# Patient Record
Sex: Female | Born: 1995
Health system: Southern US, Community
[De-identification: ages and names within clinical notes are randomized; demographics above are authoritative.]

## PROBLEM LIST (undated history)

## (undated) DIAGNOSIS — M419 Scoliosis, unspecified: Secondary | ICD-10-CM

## (undated) HISTORY — PX: TONSILLECTOMY: SUR1361

## (undated) HISTORY — PX: TYMPANOSTOMY TUBE PLACEMENT: SHX32

---
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
## 2013-03-06 ENCOUNTER — Encounter: Payer: Self-pay | Admitting: Nurse Practitioner

## 2013-03-12 ENCOUNTER — Telehealth: Payer: Self-pay | Admitting: Nurse Practitioner

## 2013-03-12 NOTE — Telephone Encounter (Signed)
Appt given for 10-31 per grandmothers request. Offered an earlier appt but patient away at college and can only be seen then

## 2013-03-20 ENCOUNTER — Ambulatory Visit: Payer: Managed Care, Other (non HMO) | Admitting: Family Medicine

## 2013-03-20 ENCOUNTER — Encounter: Payer: Self-pay | Admitting: Nurse Practitioner

## 2013-07-29 ENCOUNTER — Encounter: Payer: Self-pay | Admitting: Nurse Practitioner

## 2013-07-29 ENCOUNTER — Ambulatory Visit (INDEPENDENT_AMBULATORY_CARE_PROVIDER_SITE_OTHER): Payer: Managed Care, Other (non HMO) | Admitting: Nurse Practitioner

## 2013-07-29 VITALS — BP 156/81 | HR 93 | Temp 98.6°F | Ht 64.0 in | Wt 242.4 lb

## 2013-07-29 DIAGNOSIS — K2970 Gastritis, unspecified, without bleeding: Secondary | ICD-10-CM

## 2013-07-29 DIAGNOSIS — K2990 Gastroduodenitis, unspecified, without bleeding: Secondary | ICD-10-CM

## 2013-07-29 MED ORDER — PROMETHAZINE HCL 12.5 MG PO TABS: 12.5000 mg | ORAL_TABLET | Freq: Three times a day (TID) | ORAL | Status: DC | PRN

## 2013-07-29 NOTE — Progress Notes (Signed)
   Subjective:    Patient ID: Stacey Gonzalez, female    DOB: 07/06/1995, 18 y.o.   MRN: [id number]  HPI Patient presents today complaining of diarrhea. Began 3 days ago and have gotten worse. Associated symptoms include nausea, decreased appetite, and patient felt fever.  Has been around classmate who had stomach virus. No medication taken.    Review of Systems  Constitutional: Positive for appetite change and fatigue. Negative for fever and chills.  Respiratory: Negative for shortness of breath.   Cardiovascular: Negative for chest pain.  Gastrointestinal: Positive for nausea and diarrhea. Negative for vomiting.  All other systems reviewed and are negative.       Objective:   Physical Exam  Constitutional: She is oriented to person, place, and time.  Cardiovascular: Normal rate, regular rhythm and normal heart sounds.   Pulmonary/Chest: Effort normal and breath sounds normal.  Abdominal: Soft. Bowel sounds are normal. She exhibits no distension and no mass. There is no tenderness. There is no rebound and no guarding.  Neurological: She is alert and oriented to person, place, and time.  Skin: Skin is warm and dry.  Psychiatric: She has a normal mood and affect. Her behavior is normal. Judgment and thought content normal.   BP 156/81  Pulse 93  Temp(Src) 98.6 F (37 C) (Oral)  Ht 5\' 4"  (1.626 m)  Wt 242 lb 6.4 oz (109.952 kg)  BMI 41.59 kg/m2         Assessment & Plan:   1. Viral gastritis    Meds ordered this encounter  Medications  . promethazine (PHENERGAN) 12.5 MG tablet    Sig: Take 1 tablet (12.5 mg total) by mouth every 8 (eight) hours as needed for nausea or vomiting.    Dispense:  10 tablet    Refill:  0    Order Specific Question:  Supervising Provider    Answer:  Deborra MedinaMOORE, DONALD W [[id number]]   Force fluids Imodium AD OTC Rest Clear liquid diet until able to tolerate solid foods  Mary-Margaret Daphine DeutscherMartin, FNP

## 2013-07-29 NOTE — Patient Instructions (Signed)

## 2013-12-03 ENCOUNTER — Encounter: Payer: Self-pay | Admitting: Nurse Practitioner

## 2013-12-03 ENCOUNTER — Ambulatory Visit (INDEPENDENT_AMBULATORY_CARE_PROVIDER_SITE_OTHER): Payer: Managed Care, Other (non HMO) | Admitting: Nurse Practitioner

## 2013-12-03 VITALS — BP 122/82 | HR 87 | Temp 98.7°F | Ht 64.0 in | Wt 240.4 lb

## 2013-12-03 DIAGNOSIS — N926 Irregular menstruation, unspecified: Secondary | ICD-10-CM

## 2013-12-03 DIAGNOSIS — B009 Herpesviral infection, unspecified: Secondary | ICD-10-CM

## 2013-12-03 MED ORDER — VALACYCLOVIR HCL 1 G PO TABS: 1000.0000 mg | ORAL_TABLET | Freq: Three times a day (TID) | ORAL | Status: DC

## 2013-12-03 NOTE — Patient Instructions (Signed)
Menstruation °Menstruation is the monthly passing of blood, tissue, fluid and mucus, also know as a period. Your body is shedding the lining of the uterus. The flow, or amount of blood, usually lasts from 3-7 days each month. Hormones control the menstrual cycle. Hormones are a chemical substance produced by endocrine glands in the body to regulate different bodily functions. °The first menstrual period may start any time between age 20 years to 16 years. However, it usually starts around age 12 years. Some girls have regular monthly menstrual cycles right from the beginning. However, it is not unusual to have only a couple of drops of blood or spotting when you first start menstruating. It is also not unusual to have two periods a month or miss a month or two when first starting your periods. °SYMPTOMS  °· Mild to moderate abdominal cramps. °· Aching or pain in the lower back area. °Symptoms may occur 5-10 days before your menstrual period starts. These symptoms are referred to as premenstrual syndrome (PMS). These symptoms can include: °· Headache. °· Breast tenderness and swelling. °· Bloating. °· Tiredness (fatigue). °· Mood changes. °· Craving for certain foods. °These are normal signs and symptoms and can vary in severity. To help relieve these problems, ask your caregiver if you can take over-the-counter medications for pain or discomfort. If the symptoms are not controllable, see your caregiver for help.  °HORMONES INVOLVED IN MENSTRUATION °Menstruation comes about because of hormones produced by the pituitary gland in the brain and the ovaries that affect the uterine lining. °First, the pituitary gland in the brain produces the hormone follicle stimulating hormone (FSH). FSH stimulates the ovaries to produce estrogen, which thickens the uterine lining and begins to develop an egg in the ovary. About 14 days later, the pituitary gland produces another hormone called luteinizing hormone (LH). LH causes the egg  to come out of a sac in the ovary (ovulation). The empty sac on the ovary called the corpus luteum is stimulated by another hormone from the pituitary gland called luteotropin. The corpus luteum begins to produce the estrogen and progesterone hormone. The progesterone hormone prepares the lining of the uterus to have the fertilized egg (egg combined with sperm) attach to the lining of the uterus and begin to develop into a fetus. If the egg is not fertilized, the corpus luteum stops producing estrogen and progesterone, it disappears, the lining of the uterus sloughs off and a menstrual period begins. Then the menstrual cycle starts all over again and will continue monthly unless pregnancy occurs or menopause begins. °The secretion of hormones is complex. Various parts of the body become involved in many chemical activities. Female sex hormones have other functions in a woman's body as well. Estrogen increases a woman's sex drive (libido). It naturally helps body get rid of fluids (diuretic). It also aids in the process of building new bone. Therefore, maintaining hormonal health is essential to all levels of a woman's well being. These hormones are usually present in normal amounts and cause you to menstruate. It is the relationship between the (small) levels of the hormones that is critical. When the balance is upset, menstrual irregularities can occur. °HOW DOES THE MENSTRUAL CYCLE HAPPEN? °· Menstrual cycles vary in length from 21-35 days with an average of 29 days. The cycle begins on the first day of bleeding. At this time, the pituitary gland in the brain releases FSH that travels through the bloodstream to the ovaries. The FSH stimulates the follicles in the   ovaries. This prepares the body for ovulation that occurs around the 14th day of the cycle. The ovaries produce estrogen, and this makes sure conditions are right in the uterus for implantation of the fertilized egg. °· When the levels of estrogen reach a  high enough level, it signals the gland in the brain (pituitary gland) to release a surge of LH. This causes the release of the ripest egg from its follicle (ovulation). Usually only one follicle releases one egg, but sometimes more than one follicle releases an egg especially when stimulating the ovaries for in vitro fertilization. The egg can then be collected by either fallopian tube to await fertilization. The burst follicle within the ovary that is left behind is now called the corpus luteum or "yellow body." The corpus luteum continues to give off (secrete) reduced amounts of estrogen. This closes and hardens the cervix. It dries up the mucus to the naturally infertile condition. °· The corpus luteum also begins to give off greater amounts of progesterone. This causes the lining of the uterus (endometrium) to thicken even more in preparation for the fertilized egg. The egg is starting to journey down from the fallopian tube to the uterus. It also signals the ovaries to stop releasing eggs. It assists in returning the cervical mucus to its infertile state. °· If the egg implants successfully into the womb lining and pregnancy occurs, progesterone levels will continue to raise. It is often this hormone that gives some pregnant women a feeling of well being, like a "natural high." Progesterone levels drop again after childbirth. °· If fertilization does not occur, the corpus luteum dies, stopping the production of hormones. This sudden drop in progesterone causes the uterine lining to break down, accompanied by blood (menstruation). °· This starts the cycle back at day 1. The whole process starts all over again. Woman go through this cycle every month from puberty to menopause. Women have breaks only for pregnancy and breastfeeding (lactation), unless the woman has health problems that affect the female hormone system or chooses to use oral contraceptives to have unnatural menstrual periods. °HOME CARE  INSTRUCTIONS  °· Keep track of your periods by using a calendar. °· If you use tampons, get the least absorbent to avoid toxic shock syndrome. °· Do not leave tampons in the vagina over night or longer than 6 hours. °· Wear a sanitary pad over night. °· Exercise 3-5 times a week or more. °· Avoid foods and drinks that you know will make your symptoms worse before or during your period. °SEEK MEDICAL CARE IF:  °· You develop a fever with your period. °· Your periods are lasting more than 7 days. °· Your period is so heavy that you have to change pads or tampons every 30 minutes. °· You develop clots with your period and never had clots before. °· You cannot get relief from over-the-counter medication for your symptoms. °· Your period has not started, and it has been longer than 35 days. °Document Released: 04/27/2002 Document Revised: 05/12/2013 Document Reviewed: 12/04/2012 °[employer]® Patient Information ©2015 [employer]. This information is not intended to replace advice given to you by your health care provider. Make sure you discuss any questions you have with your health care provider. ° °

## 2013-12-03 NOTE — Progress Notes (Signed)
   Subjective:    Patient ID: Stacey Gonzalez, female    DOB: 11/08/1995, 18 y.o.   MRN: [id number]  HPI Patient is here today for menstrual irregularities. She is currently taking Junel FE. She reports her last regular cycle was 6 to 7 months ago. She reports spotting once a month  Upper lip macule that she notices on Monday and swelling the following day. Get better as the day progress. She reports the swelling is worse in the morning. She has applied ice,    Review of Systems  All other systems reviewed and are negative.      Objective:   Physical Exam  Constitutional: She is oriented to person, place, and time. She appears well-developed and well-nourished.  Cardiovascular: Normal rate, regular rhythm and normal heart sounds.   Pulmonary/Chest: Effort normal and breath sounds normal.  Neurological: She is alert and oriented to person, place, and time.  Skin: Skin is warm and dry.  Erythematous lesion on eft upper lip with mild edema  Psychiatric: She has a normal mood and affect. Her behavior is normal. Judgment and thought content normal.   BP 122/82  Pulse 87  Temp(Src) 98.7 F (37.1 C) (Oral)  Ht 5\' 4"  (1.626 m)  Wt 240 lb 6.4 oz (109.045 kg)  BMI 41.24 kg/m2        Assessment & Plan:  1. Herpes simplex type 1 infection Avoid tomatoes - valACYclovir (VALTREX) 1000 MG tablet; Take 1 tablet (1,000 mg total) by mouth 3 (three) times daily.  Dispense: 21 tablet; Refill: 0  2. Irregular menses As long as having a period that is all that matters- do not have  To have a long menses to be normal.   Mary-Margaret Daphine DeutscherMartin, FNP

## 2014-08-12 ENCOUNTER — Ambulatory Visit (INDEPENDENT_AMBULATORY_CARE_PROVIDER_SITE_OTHER): Payer: Managed Care, Other (non HMO)

## 2014-08-12 ENCOUNTER — Ambulatory Visit (INDEPENDENT_AMBULATORY_CARE_PROVIDER_SITE_OTHER): Payer: Managed Care, Other (non HMO) | Admitting: Orthopedic Surgery

## 2014-08-12 VITALS — BP 95/56 | Ht 64.0 in | Wt 250.0 lb

## 2014-08-12 DIAGNOSIS — M25511 Pain in right shoulder: Secondary | ICD-10-CM

## 2014-08-12 NOTE — Progress Notes (Signed)
  Stacey Gonzalez is a 19 y.o. female  Shoulder Pain:   Chief Complaint  Patient presents with  . 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 Denies any history of trauma. Symptoms for one month improved with rest and ibuprofen. She is on spring break and notices that she's not carrying the heavy book bag and the symptoms are much improved. She complains of dull burning aching pain constantly over the right shoulder joint.  Her review of systems is normal  She's healthy without medical problems or surgical history she does take medication which is for anemia. She has a allergy to Zithromax and latex. No smoking or drinking family history is negative   No past medical history on file. No past surgical history on file.  Current outpatient prescriptions:  .  JUNEL FE 1.5/30 1.5-30 MG-MCG tablet, TAKE 1 TABLET BY MOUTH EVERY DAY, Disp: 28 tablet, Rfl: 13 Allergies  Allergen Reactions  . Zithromax [Azithromycin] Rash    reports that she has never smoked. She does not have any smokeless tobacco history on file. She reports that she does not drink alcohol or use illicit drugs. Family History  Problem Relation Age of Onset  . Cancer Mother     colorectal    BP 95/56 mmHg  Ht 5\' 4"  (1.626 m)  Wt 250 lb (113.399 kg)  BMI 42.89 kg/m2  LMP 08/11/2014 (Approximate) She is awake alert and oriented 3 mood and affect are normal her appearance is normal except for some mild obesity. Her mood is pleasant her affect is normal her ambulation is unremarkable Right Shoulder: Inspection reveals no abnormalities, atrophy or asymmetry. Palpation reveals tenderness in the anterior shoulder joint and rotator interval no tenderness over AC joint or bicipital groove. ROM is full in all planes. Rotator cuff strength normal throughout. No signs of impingement with negative  Neer and Hawkin's tests, empty can. Speeds and Yergason's tests normal. No labral pathology noted with negative Obrien's, negative clunk and good stability. Normal scapular function observed. No painful arc and no drop arm sign. No apprehension sign.  Cervical spine nontender  Left shoulder normal range of motion stability and strength  Office x-rays were ordered I interpreted those is normal without fracture dislocation or bone lesion.  Recommend alternating arms when carrying her book bag continue ibuprofen no other treatment needed

## 2015-01-01 ENCOUNTER — Emergency Department (HOSPITAL_COMMUNITY)
Admission: EM | Admit: 2015-01-01 | Discharge: 2015-01-02 | Disposition: A | Discharge status: Home / Self Care | Payer: Managed Care, Other (non HMO) | Attending: Emergency Medicine | Admitting: Emergency Medicine

## 2015-01-01 ENCOUNTER — Emergency Department (HOSPITAL_COMMUNITY): Payer: Managed Care, Other (non HMO)

## 2015-01-01 ENCOUNTER — Encounter (HOSPITAL_COMMUNITY): Payer: Self-pay | Admitting: Emergency Medicine

## 2015-01-01 DIAGNOSIS — S83005A Unspecified dislocation of left patella, initial encounter: Secondary | ICD-10-CM | POA: Diagnosis not present

## 2015-01-01 DIAGNOSIS — Y9289 Other specified places as the place of occurrence of the external cause: Secondary | ICD-10-CM | POA: Insufficient documentation

## 2015-01-01 DIAGNOSIS — Z91040 Latex allergy status: Secondary | ICD-10-CM | POA: Insufficient documentation

## 2015-01-01 DIAGNOSIS — S8992XA Unspecified injury of left lower leg, initial encounter: Secondary | ICD-10-CM | POA: Diagnosis present

## 2015-01-01 DIAGNOSIS — Y9389 Activity, other specified: Secondary | ICD-10-CM | POA: Diagnosis not present

## 2015-01-01 DIAGNOSIS — Y998 Other external cause status: Secondary | ICD-10-CM | POA: Diagnosis not present

## 2015-01-01 DIAGNOSIS — X58XXXA Exposure to other specified factors, initial encounter: Secondary | ICD-10-CM | POA: Diagnosis not present

## 2015-01-01 MED ORDER — FENTANYL CITRATE (PF) 100 MCG/2ML IJ SOLN
50.0000 ug | Freq: Once | INTRAMUSCULAR | Status: AC
  Administered 2015-01-01: 50 ug via INTRAVENOUS
  Filled 2015-01-01: qty 2

## 2015-01-01 NOTE — ED Provider Notes (Signed)
CSN: [id number]     Arrival date & time 01/01/15  2154 History   None    Chief Complaint  Patient presents with  . Dislocation    knee     (Consider location/radiation/quality/duration/timing/severity/associated sxs/prior Treatment) HPI  History reviewed. No pertinent past medical history. History reviewed. No pertinent past surgical history. Family History  Problem Relation Age of Onset  . Cancer Mother     colorectal    Social History  Substance Use Topics  . Smoking status: Never Smoker   . Smokeless tobacco: None  . Alcohol Use: No   OB History    No data available     Review of Systems    Allergies  Latex and Zithromax  Home Medications   Prior to Admission medications   Medication Sig Start Date End Date Taking? Authorizing Provider  JUNEL FE 1.5/30 1.5-30 MG-MCG tablet TAKE 1 TABLET BY MOUTH EVERY DAY 10/05/12  Yes Mary-Margaret Daphine Deutscher, FNP   BP 108/68 mmHg  Pulse 86  Temp(Src) 98.7 F (37.1 C) (Oral)  Resp 20  Ht  (1.651 m)  Wt 256 lb (116.121 kg)  BMI 42.60 kg/m2  SpO2 100%  LMP 01/01/2015 Physical Exam  ED Course  Procedures (including critical care time) Labs Review Labs Reviewed - No data to display  Imaging Review No results found. I, Doug Sou, personally reviewed and evaluated these images and lab results as part of my medical decision-making.   EKG Interpretation None      MDM   Final diagnoses:  None    Please delete this note. I did not evaluate this patient    Doug Sou, MD 01/02/15 [phone]

## 2015-01-01 NOTE — ED Provider Notes (Signed)
CSN: [id number]     Arrival date & time 01/01/15  2154 History  This chart was scribed for Devoria Albe, MD by Budd Palmer, ED Scribe. This patient was seen in room APA08/APA08 and the patient's care was started at 11:06 PM.    Chief Complaint  Patient presents with  . Dislocation    knee   The history is provided by the patient. No language interpreter was used.   HPI Comments: Stacey Gonzalez is a 19 y.o. female brought in by ambulance, who presents to the Emergency Department complaining of a dislocated left knee which occurred just PTA. She was on the floor on her knees playing with her dogs. She states that when she twisted and turned (pivoted) on her left knee while trying to get up  With her left knee on the floor, she felt it pop and was unable to stand. She has never had trouble with this knee before. She has been to [facility] in the past. Pt takes BCP. She is a Consulting civil engineer at [employer]. She denies smoking or drinking alcohol.  Pt denies numbness in the toes.  [facility] in [facility] [address]o Apple and [facility]  History reviewed. No pertinent past medical history. History reviewed. No pertinent past surgical history. Family History  Problem Relation Age of Onset  . Cancer Mother     colorectal    Social History  Substance Use Topics  . Smoking status: Never Smoker   . Smokeless tobacco: None  . Alcohol Use: No   College student  OB History    No data available     Review of Systems  All other systems reviewed and are negative.   Allergies  Latex and Zithromax  Home Medications   Prior to Admission medications   Medication Sig Start Date End Date Taking? Authorizing Provider  clindamycin-benzoyl peroxide (BENZACLIN) gel Apply 1 application topically every other day. 11/17/14  Yes Historical Provider, MD  JUNEL FE 1.5/30 1.5-30 MG-MCG tablet TAKE 1 TABLET BY MOUTH EVERY DAY 10/05/12  Yes Mary-Margaret Daphine Deutscher, FNP   HYDROcodone-acetaminophen (NORCO/VICODIN) 5-325 MG per tablet Take 1 tablet by mouth every 6 (six) hours as needed for moderate pain or severe pain. 01/02/15   Devoria Albe, MD  naproxen (NAPROSYN) 500 MG tablet Take 1 po BID with food prn pain 01/02/15   Devoria Albe, MD   BP 125/80 mmHg  Pulse 100  Temp(Src) 98.7 F (37.1 C) (Oral)  Resp 17  Ht 5\' 5"  (1.651 m)  Wt 256 lb (116.121 kg)  BMI 42.60 kg/m2  SpO2 100%  LMP 01/01/2015  Vital signs normal   Physical Exam  Constitutional: She is oriented to person, place, and time. She appears well-developed and well-nourished.  Non-toxic appearance. She does not appear ill. No distress.  HENT:  Head: Normocephalic and atraumatic.  Right Ear: External ear normal.  Left Ear: External ear normal.  Nose: Nose normal. No mucosal edema or rhinorrhea.  Mouth/Throat: Mucous membranes are normal. No dental abscesses or uvula swelling.  Eyes: Conjunctivae and EOM are normal.  Neck: Normal range of motion and full passive range of motion without pain.  Pulmonary/Chest: Effort normal. No respiratory distress. She has no rhonchi. She exhibits no crepitus.  Abdominal: Normal appearance.  Musculoskeletal: She exhibits edema and tenderness.  L knee is in a splint placed by EMS, was removed by me.  Her anterior knee has a void and her kneecap appears to be dislocated laterally. She has good  distal pulses, cap refill, and sensation.She has her left leg in extension.   Neurological: She is alert and oriented to person, place, and time. She has normal strength. No cranial nerve deficit.  Skin: Skin is warm, dry and intact. No rash noted. No erythema. No pallor.  Psychiatric: She has a normal mood and affect. Her speech is normal and behavior is normal. Her mood appears not anxious.  Nursing note and vitals reviewed.   ED Course  Procedures   Medications  propofol (DIPRIVAN) 10 mg/mL bolus/IV push 200 mg (130 mg Intravenous See Procedure Record 01/02/15 0132)   fentaNYL (SUBLIMAZE) injection 50 mcg (50 mcg Intravenous Given 01/01/15 2346)  propofol (DIPRIVAN) 10 mg/mL bolus/IV push ( Intravenous Stopped 01/02/15 0133)  propofol (DIPRIVAN) 10 mg/mL bolus/IV push ( Intravenous Stopped 01/02/15 0133)  propofol (DIPRIVAN) 10 mg/mL bolus/IV push ( Intravenous Stopped 01/02/15 0133)    DIAGNOSTIC STUDIES: Oxygen Saturation is 100% on RA, normal by my interpretation.    COORDINATION OF CARE: 11:11 PM - Discussed plans to order diagnostic imaging. Discussed possible dislocation of the patella. Pt advised of plan for treatment and pt agrees.  00:21 Radiologist called her xray results.   12:41 AM - Discussed imaging results of dislocated kneecap. Discussed plans to sedate and reduce the knee. Advised to f/u with orthopedist afterwards. Pt advised of plan for treatment and pt agrees.  Patient was given procedural sedation because normally the knee is held in flexion with the dislocated patella and can be reduced by extending the knee however she has her knee in extension. I felt her dislocation may be hard to relocate.  Patient was rechecked at discharge after radiologist read her post reduction films. She was offered more pain medication however she states she has no pain at this time.   Procedural sedation Performed by: Devoria Albe L Consent: Verbal consent obtained. Risks and benefits: risks, benefits and alternatives were discussed Required items: required blood products, implants, devices, and special equipment available Patient identity confirmed: arm band and provided demographic data Time out: Immediately prior to procedure a "time out" was called to verify the correct patient, procedure, equipment, support staff and site/side marked as required.  Sedation type: moderate (conscious) sedation NPO time confirmed and considedered  Sedatives: PROPOFOL 130 mg titrated until sedation acquired  Pt's patella was manipulated and was reduced after several  attempts. I assisted nursing staff in placing a knee immobilizer on her left leg.   Post-reduction xray was ordered.   Physician Time at Bedside: 20 min  Vitals: Vital signs were monitored during sedation. Cardiac Monitor, pulse oximeter Patient tolerance: Patient tolerated the procedure well with no immediate complications. Comments: Pt with uneventful recovered. Returned to pre-procedural sedation baseline    Labs Review Labs Reviewed - No data to display  Imaging Review Dg Knee Complete 4 Views Left  01/02/2015   CLINICAL DATA:  Acute onset of left knee pain.  Initial encounter.  EXAM: LEFT KNEE - COMPLETE 4+ VIEW  COMPARISON:  None.  FINDINGS: There is lateral dislocation of the patella, lodged against the lateral aspect of the tibia. No definite fracture is characterized.  The joint spaces are otherwise preserved. No significant degenerative change is seen; the patellofemoral joint is grossly unremarkable in appearance.  No significant joint effusion is seen. The visualized soft tissues are normal in appearance.  IMPRESSION: Lateral dislocation of the patella, lodged against the lateral aspect of the tibia. No definite fracture seen at this time.  These results were called  by telephone at the time of interpretation on 01/02/2015 at 12:21 am to Dr. Devoria Albe, who verbally acknowledged these results.   Electronically Signed   By: Roanna Raider M.D.   On: 01/02/2015 00:21   Dg Knee Left Port  01/02/2015   CLINICAL DATA:  Initial evaluation status post reduction of left patella.  EXAM: PORTABLE LEFT KNEE - 1-2 VIEW  COMPARISON:  Prior radiograph from earlier the same day.  FINDINGS: There has been interval reduction of the patella, which now was in gross anatomic alignment. No acute fracture. No significant joint effusion. No soft tissue abnormality.  IMPRESSION: Left patella now in gross anatomic alignment status post reduction. No acute fracture.   Electronically Signed   By: Rise Mu M.D.   On: 01/02/2015 02:24   I, Hiliana Eilts L, personally reviewed and evaluated these images and lab results as part of my medical decision-making.   EKG Interpretation None      MDM   Final diagnoses:  Dislocated patella, left, initial encounter    Discharge Medication List as of 01/02/2015  2:49 AM    START taking these medications   Details  HYDROcodone-acetaminophen (NORCO/VICODIN) 5-325 MG per tablet Take 1 tablet by mouth every 6 (six) hours as needed for moderate pain or severe pain., Starting 01/02/2015, Until Discontinued, Print    naproxen (NAPROSYN) 500 MG tablet Take 1 po BID with food prn pain, Print        Plan discharge  Devoria Albe, MD, FACEP   I personally performed the services described in this documentation, which was scribed in my presence. The recorded information has been reviewed and considered.  Devoria Albe, MD, Concha Pyo, MD 01/02/15 [phone]

## 2015-01-01 NOTE — ED Notes (Signed)
Per EMS patient called ems due to left knee pain. EMS states knees looks dislocated. Patient states she was on the floor and turned. States knee popped.  EMS gave 8 mg of morphine.

## 2015-01-02 ENCOUNTER — Emergency Department (HOSPITAL_COMMUNITY): Payer: Managed Care, Other (non HMO)

## 2015-01-02 MED ORDER — NAPROXEN 500 MG PO TABS: ORAL_TABLET | ORAL | Status: DC

## 2015-01-02 MED ORDER — HYDROCODONE-ACETAMINOPHEN 5-325 MG PO TABS: 1.0000 | ORAL_TABLET | Freq: Four times a day (QID) | ORAL | Status: DC | PRN

## 2015-01-02 MED ORDER — PROPOFOL 10 MG/ML IV BOLUS
INTRAVENOUS | Status: AC | PRN
  Administered 2015-01-02: 40 mg via INTRAVENOUS

## 2015-01-02 MED ORDER — PROPOFOL 10 MG/ML IV BOLUS
INTRAVENOUS | Status: AC | PRN
  Administered 2015-01-02: 30 mg via INTRAVENOUS

## 2015-01-02 MED ORDER — PROPOFOL 10 MG/ML IV BOLUS
200.0000 mg | Freq: Once | INTRAVENOUS | Status: DC
  Filled 2015-01-02: qty 20

## 2015-01-02 MED ORDER — PROPOFOL 10 MG/ML IV BOLUS
INTRAVENOUS | Status: AC | PRN
  Administered 2015-01-02: 20 mg via INTRAVENOUS

## 2015-01-02 NOTE — Discharge Instructions (Signed)
Elevate your leg. Use ice packs to help keep the swelling down. Wear the knee immobilizer at all times until you can be rechecked by an orthopedist. Call on Monday, August 15, to get an appointment. You had a "Dislocation of my left patella or knee cap".  Patellar Dislocation A patellar dislocation occurs when your kneecap (patella) slips out of its normal position in a groove in front of the lower end of your thighbone (femur). This groove is called the patellofemoral groove.  CAUSES The kneecap is normally positioned over the front of the knee joint at the base of the thighbone. A kneecap can be dislocated when:  The kneecap is out of place (patellar tracking disorder), and force is applied.  The foot is firmly planted pointing outward, and the knee bends with the thigh turned inward. This kind of injury is common during many sports activities.  The inner edge of the kneecap is hit, pushing it toward the outer side of the leg. SIGNS AND SYMPTOMS  Severe pain.  A misshapen knee that looks like a bone is out of position.  A popping sensation, followed by a feeling that something is out of place.  Inability to bend or straighten the knee.  Knee swelling.  Cool, pale skin or numbness and tingling in or below the affected knee. DIAGNOSIS  Your health care provider will physically examine the injured area. An X-ray exam may be done to make sure a bone fracture has not occurred. In some cases, your health care provider may look inside your knee joint with an instrument much like a pencil-sized telescope (arthroscope). This may be done to make sure you have no loose cartilage in your joint. Loose cartilage is not visible on an X-ray image. TREATMENT  In many instances, the patella can be guided back into position without much difficulty. It often goes back into position by straightening the leg. Often, nothing more may be needed other than a brief period of immobilization followed by the  exercises your health care provider recommends. If patellar dislocation starts to become frequent after the first incident, surgery may be needed to prevent your patella from slipping out of place. HOME CARE INSTRUCTIONS   Only take over-the-counter or prescription medicines for pain, discomfort, or fever as directed by your health care provider.  Use a knee brace if directed to do so by your health care provider.  Use crutches as instructed.  Apply ice to the injured knee:  Put ice in a plastic bag.  Place a towel between your skin and the bag.  Leave the ice on for 20 minutes, 2-3 times a day.  Follow your health care provider's instructions for doing any recommended range-of-motion exercises or other exercises. SEEK IMMEDIATE MEDICAL CARE IF:  You have increased pain or swelling in the knee that is not relieved with medicine.  You have increasing inflammation in the knee.  You have locking or catching of your knee. MAKE SURE YOU:  Understand these instructions.  Will watch your condition.  Will get help right away if you are not doing well or get worse. Document Released: 01/30/2001 Document Revised: 02/25/2013 Document Reviewed: 12/17/2012 [facility] Patient Information 2015 [place], [place]. This information is not intended to replace advice given to you by your health care provider. Make sure you discuss any questions you have with your health care provider.

## 2015-01-02 NOTE — ED Notes (Signed)
Knee immobilizer applied, cms intact distal

## 2015-01-04 ENCOUNTER — Encounter: Payer: Self-pay | Admitting: Orthopedic Surgery

## 2015-01-04 ENCOUNTER — Ambulatory Visit (INDEPENDENT_AMBULATORY_CARE_PROVIDER_SITE_OTHER): Payer: Managed Care, Other (non HMO) | Admitting: Orthopedic Surgery

## 2015-01-04 VITALS — Ht 65.0 in | Wt 256.0 lb

## 2015-01-04 DIAGNOSIS — S83005D Unspecified dislocation of left patella, subsequent encounter: Secondary | ICD-10-CM

## 2015-01-04 NOTE — Progress Notes (Signed)
Established patient new problem  Chief complaint pain left knee after patella dislocation on August 14  Injury sustained when getting up and pivoting first time patella dislocation  Patient went to ER for reduction had reduction under anesthesia presents with knee immobilizer complaining of mild aching pain left knee  The patient is able to ambulate with a knee immobilizer  Review of systems is negative for any neurologic or vascular complaints related to the knee injury  She is awake alert and oriented 3 appearance is normal mood is normal affect is normal gait is antalgic with a brace left knee knee is slightly swollen passive range of motion 0-90 patellofemoral motion causes discomfort and apprehension knee is stable in anterior posterior plane as well as the coronal plane motor exam is normal for muscle tone and strength skin is intact sensation is normal pulses are good lymph nodes are negative  X-rays were reviewed I interpret this as a patellar dislocation with subsequent relocation x-rays and no fracture  Patient will be in brace for 3 weeks when walking she can take the leg out of the brace for knee range of motion exercises but should wear it when walking  Return 3 weeks for patellofemoral stabilizer brace custom fit 28 inch thigh 10 cm above the patella

## 2015-01-04 NOTE — Patient Instructions (Signed)
Knee immobilizer brace x 3 weeks while walking  Out of work note 3 weeks   College note- [employer]- Injured left knee, will need skype classes or online classes x 6 weeks

## 2015-01-14 ENCOUNTER — Encounter: Payer: Self-pay | Admitting: Orthopedic Surgery

## 2015-01-25 ENCOUNTER — Encounter: Payer: Self-pay | Admitting: Orthopedic Surgery

## 2015-01-25 ENCOUNTER — Ambulatory Visit (INDEPENDENT_AMBULATORY_CARE_PROVIDER_SITE_OTHER): Payer: Managed Care, Other (non HMO) | Admitting: Orthopedic Surgery

## 2015-01-25 VITALS — BP 134/96 | Ht 65.0 in | Wt 256.0 lb

## 2015-01-25 DIAGNOSIS — S83006A Unspecified dislocation of unspecified patella, initial encounter: Secondary | ICD-10-CM | POA: Insufficient documentation

## 2015-01-25 DIAGNOSIS — S83005D Unspecified dislocation of left patella, subsequent encounter: Secondary | ICD-10-CM | POA: Diagnosis not present

## 2015-01-25 DIAGNOSIS — S83005S Unspecified dislocation of left patella, sequela: Secondary | ICD-10-CM | POA: Diagnosis not present

## 2015-01-25 NOTE — Patient Instructions (Signed)
Call [facility] dept located in [place] to schedule therapy visits

## 2015-01-25 NOTE — Progress Notes (Signed)
Patient ID: SHALON SALADO, female   DOB: June 11, 1995, 19 y.o.   MRN: [id number]  Follow up visit  Chief Complaint  Patient presents with  . Follow-up    3 week follow up left patella disclocation, DOI 01/01/15    BP 134/96 mmHg  Ht  (1.651 m)  Wt 256 lb (116.121 kg)  BMI 42.60 kg/m2  LMP 01/01/2015  Encounter Diagnoses  Name Primary?  . Patellar dislocation, left, sequela   . Dislocation of left patella, subsequent encounter Yes    The patient had a left patellar dislocation August 14 Went to the ER for reduction under anesthesia did well with that.  3 weeks in a knee immobilizer comes back for bracing  Custom brace ordered fits appropriately.   BP 134/96 mmHg  Ht  (1.651 m)  Wt 256 lb (116.121 kg)  BMI 42.60 kg/m2  LMP 01/01/2015 Grooming hygiene normal  ambulates without assistance  neurovascular exam intact Knee flexion approximate 90 Straight leg raise shows mild weakness without extensor lag indicating no extensive quadriceps weakness  Pain has resolved to the point of just needing Tylenol  Review of systems no neurologic or vascular complaints related to the left knee injury  Brace fit  Start physical therapy twice a week for 6 weeks follow-up 6 weeks

## 2015-02-03 ENCOUNTER — Ambulatory Visit: Payer: Managed Care, Other (non HMO) | Attending: Orthopedic Surgery | Admitting: Physical Therapy

## 2015-02-03 DIAGNOSIS — M25562 Pain in left knee: Secondary | ICD-10-CM | POA: Insufficient documentation

## 2015-02-03 DIAGNOSIS — M25662 Stiffness of left knee, not elsewhere classified: Secondary | ICD-10-CM | POA: Insufficient documentation

## 2015-02-03 NOTE — Therapy (Signed)
[facility] [address] [place], [place], [address] Phone: [phone]   Fax:  [phone]  Physical Therapy Evaluation  Patient Details  Name: Stacey Gonzalez MRN: [id number] Date of Birth: [birth year] Referring Provider:  Vickki Hearing, MD  Encounter Date: 02/03/2015      PT End of Session - 02/03/15 1723    Visit Number 1   Number of Visits 12   Date for PT Re-Evaluation 03/24/15   PT Start Time 0105   PT Stop Time 0149   PT Time Calculation (min) 44 min   Activity Tolerance Patient tolerated treatment well   Behavior During Therapy [facility] for tasks assessed/performed      No past medical history on file.  No past surgical history on file.  There were no vitals filed for this visit.  Visit Diagnosis:  Left knee pain - Plan: PT plan of care cert/re-cert  Knee stiffness, left - Plan: PT plan of care cert/re-cert      Subjective Assessment - 02/03/15 1552    Subjective (p) Left knee will feel like a toothache if up too much.   Limitations (p) Walking   How long can you walk comfortably? (p) 20-30 minutes.            [facility] PT Assessment - 02/03/15 0001    Assessment   Medical Diagnosis Left patellar dislocation.   Onset Date/Surgical Date --  ~4 weeks.   Precautions   Precautions Knee   Required Braces or Orthoses Other Brace/Splint   Other Brace/Splint Left knee brace with patellar orifice.   Restrictions   Weight Bearing Restrictions No   Balance Screen   Has the patient fallen in the past 6 months No   Has the patient had a decrease in activity level because of a fear of falling?  No   Is the patient reluctant to leave their home because of a fear of falling?  No   Home Environment   Living Environment Private residence   Prior Function   Level of Independence Independent   Observation/Other Assessments-Edema    Edema --  No left knee edema.   Posture/Postural Control   Posture Comments Generally good  LE posture.   ROM / Strength   AROM / PROM / Strength AROM;Strength   AROM   Overall AROM Comments Left knee flexion to 90 degrees and full active left knee extension.   Strength   Overall Strength Comments Normal left knee and hip strength.   Palpation   Palpation comment Pain complaints under the kneecap.   Ambulation/Gait   Gait Comments Essentially normal gait cycle with left knee brace donned.                   [facility] Adult PT Treatment/Exercise - 02/03/15 0001    Exercises   Exercises Knee/Hip   Knee/Hip Exercises: Aerobic   Nustep Level 3 x 10 minutes.                     PT Long Term Goals - 02/03/15 1732    PT LONG TERM GOAL #1   Title Ind with HEP.   Time 4   Period Weeks   Status New   PT LONG TERM GOAL #2   Title Active left knee flexion= 115 degrees.   Time 4   Period Weeks   Status New   PT LONG TERM GOAL #3   Title Perform ADL's with pain not > 2-3/10.  Time 4   Period Weeks   Status New   PT LONG TERM GOAL #4   Title Walk a community distance with pain not > 2-3/10.   Time 4   Period Weeks   Status New               Plan - 02/03/15 1716    Clinical Impression Statement The patient was down on her floor approximately 4 weeks ago and turned to look at her dog when her right patella dislocated.  She was transported to an ED to have it relocated.  She was initially in a knee immobilizer but is now in a knee brace with a patellar orifice.  Her resting pain-level is a 2/10 but rises to 7/10 after being up for extended periods of time.   Pt will benefit from skilled therapeutic intervention in order to improve on the following deficits Pain;Decreased activity tolerance;Decreased range of motion   Rehab Potential Excellent   PT Frequency 2x / week   PT Duration 6 weeks  or 12 visits.   PT Treatment/Interventions Therapeutic exercise;Therapeutic activities;Manual techniques   PT Next Visit Plan Nustep with progression to  stationary bike.  Left hip and knee exercise especially pain-free quadricep work and neuro-education/proprioception exercises.   Consulted and Agree with Plan of Care Patient         Problem List Patient Active Problem List   Diagnosis Date Noted  . Patellar dislocation 01/25/2015    [place], [place] MPT 02/03/2015, 5:35 PM  [facility] [address] [place], [place], [address] Phone: [phone]   Fax:  [phone]

## 2015-02-09 ENCOUNTER — Encounter: Payer: Self-pay | Admitting: Physical Therapy

## 2015-02-09 ENCOUNTER — Ambulatory Visit: Payer: Managed Care, Other (non HMO) | Admitting: Physical Therapy

## 2015-02-09 DIAGNOSIS — M25662 Stiffness of left knee, not elsewhere classified: Secondary | ICD-10-CM

## 2015-02-09 DIAGNOSIS — M25562 Pain in left knee: Secondary | ICD-10-CM

## 2015-02-09 NOTE — Patient Instructions (Signed)
Strengthening: Straight Leg Raise (Phase 1)   Tighten muscles on front of right thigh, then lift leg ___4_ inches from surface, keeping knee locked.  Repeat __10__ times per set. Do __3__ sets per session. Do _3___ sessions per day.  http://orth.exer.us/614   Copyright  VHI. All rights reserved.  Straight Leg Raise: With External Leg Rotation   Lie on back with right leg straight, opposite leg bent. Rotate straight leg out and lift __4__ inches. Repeat __10__ times per set. Do __3__ sets per session. Do __3__ sessions per day.  http://orth.exer.us/728   Copyright  VHI. All rights reserved.  Strengthening: Hip Abduction (Side-Lying)   Tighten muscles on front of left thigh, then lift leg _4___ inches from surface, keeping knee locked.  Repeat __10__ times per set. Do __3__ sets per session. Do _3___ sessions per day.  http://orth.exer.us/622   Copyright  VHI. All rights reserved.  Strengthening: Hip Extension (Prone)   Tighten muscles on front of left thigh, then lift leg _4___ inches from surface, keeping knee locked. Repeat __10__ times per set. Do __3__ sets per session. Do __3__ sessions per day.  http://orth.exer.us/620   Copyright  VHI. All rights reserved.  Strengthening: Hip Extension (Prone)   Tighten muscles on front of left thigh, then lift leg __4__ inches from surface, keeping knee locked. Repeat _10___ times per set. Do _3___ sets per session. Do _3___ sessions per day.  http://orth.exer.us/620   Copyright  VHI. All rights reserved.

## 2015-02-09 NOTE — Therapy (Signed)
[facility] [address] [place], [place], [address] Phone: [phone]   Fax:  [phone]  Physical Therapy Treatment  Patient Details  Name: Stacey Gonzalez MRN: [id number] Date of Birth: February 08, 1996 Referring Provider:  Bennie Pierini, *  Encounter Date: 02/09/2015      PT End of Session - 02/09/15 1025    Visit Number 2   Number of Visits 12   Date for PT Re-Evaluation 03/24/15   PT Start Time 0952   PT Stop Time 1016   PT Time Calculation (min) 24 min   Activity Tolerance Patient tolerated treatment well   Behavior During Therapy [facility] for tasks assessed/performed      No past medical history on file.  No past surgical history on file.  There were no vitals filed for this visit.  Visit Diagnosis:  Left knee pain  Knee stiffness, left      Subjective Assessment - 02/09/15 0955    Subjective Reports that L knee feels tight this morning but not painful.   How long can you stand comfortably? 30 minutes   Currently in Pain? No/denies            [facility] PT Assessment - 02/09/15 0001    Assessment   Medical Diagnosis Left patellar dislocation.   Next MD Visit 03/08/2015   Precautions   Precautions Knee   Required Braces or Orthoses Other Brace/Splint   Other Brace/Splint Left knee brace with patellar orifice.                     [facility] Adult PT Treatment/Exercise - 02/09/15 0001    Knee/Hip Exercises: Aerobic   Stationary Bike L1 x8 min   Knee/Hip Exercises: Standing   Terminal Knee Extension Limitations LLE pink XTS x30 reps with 3 sec hold   Knee/Hip Exercises: Supine   Straight Leg Raises Strengthening;Left;3 sets;10 reps   Straight Leg Raise with External Rotation Strengthening;Left;3 sets;10 reps   Knee/Hip Exercises: Sidelying   Hip ABduction Strengthening;Left;3 sets;10 reps                     PT Long Term Goals - 02/03/15 1732    PT LONG TERM GOAL #1   Title Ind with  HEP.   Time 4   Period Weeks   Status New   PT LONG TERM GOAL #2   Title Active left knee flexion= 115 degrees.   Time 4   Period Weeks   Status New   PT LONG TERM GOAL #3   Title Perform ADL's with pain not > 2-3/10.   Time 4   Period Weeks   Status New   PT LONG TERM GOAL #4   Title Walk a community distance with pain not > 2-3/10.   Time 4   Period Weeks   Status New               Plan - 02/09/15 1040    Clinical Impression Statement Patient tolerated treatment well today with no complaints of pain with any exercises. Completed all exercises as directed with multimodal cueing for proper technique. Accpeted new strengthening HEP without questions. Left appointment early secondary to exam at school and denied pain following treatment.   Pt will benefit from skilled therapeutic intervention in order to improve on the following deficits Pain;Decreased activity tolerance;Decreased range of motion   Rehab Potential Excellent   PT Frequency 2x / week   PT Duration 6 weeks  PT Treatment/Interventions Therapeutic exercise;Therapeutic activities;Manual techniques   PT Next Visit Plan Nustep with progression to stationary bike.  Left hip and knee exercise especially pain-free quadricep work and neuro-education/proprioception exercises.   Consulted and Agree with Plan of Care Patient        Problem List Patient Active Problem List   Diagnosis Date Noted  . Patellar dislocation 01/25/2015    Evelene Croon, PTA 02/09/2015, 10:42 AM  [facility] [address] [place], [place], [address] Phone: [phone]   Fax:  [phone]

## 2015-02-10 ENCOUNTER — Ambulatory Visit: Payer: Managed Care, Other (non HMO) | Admitting: Physical Therapy

## 2015-02-10 ENCOUNTER — Encounter: Payer: Self-pay | Admitting: Physical Therapy

## 2015-02-10 DIAGNOSIS — M25662 Stiffness of left knee, not elsewhere classified: Secondary | ICD-10-CM

## 2015-02-10 DIAGNOSIS — M25562 Pain in left knee: Secondary | ICD-10-CM

## 2015-02-10 NOTE — Therapy (Signed)
[facility] [address] [place], [place], [address] Phone: [phone]   Fax:  [phone]  Physical Therapy Treatment  Patient Details  Name: Stacey Gonzalez MRN: [id number] Date of Birth: 06-Jul-1995 Referring Provider:  Bennie Pierini, *  Encounter Date: 02/10/2015      PT End of Session - 02/10/15 0816    Visit Number 3   Number of Visits 12   Date for PT Re-Evaluation 03/24/15   PT Start Time 0814   PT Stop Time 0902   PT Time Calculation (min) 48 min   Activity Tolerance Patient tolerated treatment well   Behavior During Therapy [facility] for tasks assessed/performed      No past medical history on file.  No past surgical history on file.  There were no vitals filed for this visit.  Visit Diagnosis:  Left knee pain  Knee stiffness, left      Subjective Assessment - 02/10/15 0816    Subjective Reports feeling a burning sensation medially last night during the second round of SLR with ER in HEP.   How long can you stand comfortably? 30 minutes   Currently in Pain? No/denies            [facility] PT Assessment - 02/10/15 0001    Assessment   Medical Diagnosis Left patellar dislocation.   Next MD Visit 03/08/2015  Dr. Romeo Apple   Precautions   Precautions Knee   Required Braces or Orthoses Other Brace/Splint   Other Brace/Splint Left knee brace with patellar orifice.                     [facility] Adult PT Treatment/Exercise - 02/10/15 0001    Knee/Hip Exercises: Aerobic   Nustep L4 x 12 min   Knee/Hip Exercises: Standing   Terminal Knee Extension Limitations LLE pink XTS x30 reps with 3 sec hold   Forward Step Up Left;1 set;10 reps;Hand Hold: 2;Step Height: 6"  Reported medium pain under L patella during step ups   Rocker Board 3 minutes   Knee/Hip Exercises: Supine   Short Arc Quad Sets Strengthening;Left;3 sets;10 reps;Other (comment)  10 sec hold   Straight Leg Raises Strengthening;Left;3 sets;10  reps   Straight Leg Raise with External Rotation Strengthening;Left;3 sets;10 reps   Knee/Hip Exercises: Sidelying   Hip ABduction Strengthening;Left;3 sets;10 reps   Hip ADduction Strengthening;Left;3 sets;10 reps   Knee/Hip Exercises: Prone   Hip Extension Strengthening;Left;3 sets;10 reps                     PT Long Term Goals - 02/10/15 0834    PT LONG TERM GOAL #1   Title Ind with HEP.   Time 4   Period Weeks   Status Achieved   PT LONG TERM GOAL #2   Title Active left knee flexion= 115 degrees.   Time 4   Period Weeks   Status On-going   PT LONG TERM GOAL #3   Title Perform ADL's with pain not > 2-3/10.   Time 4   Period Weeks   Status Achieved   PT LONG TERM GOAL #4   Title Walk a community distance with pain not > 2-3/10.   Time 4   Period Weeks   Status On-going               Plan - 02/10/15 1016    Clinical Impression Statement Patient tolerated treatment well today and knowledged that the SLR with ER was the  exercise that caused the medial knee pain last night but did not report whether she had the pain reoccur today in clinic. Experienced moderate pain during forward L step under the L patella. Completed all other exercises as directed with minimal multimodal cueing for proper technique. Denied pain following treatment today. Achieved LT goal #1 (ind with HEP) and LT goal #4 (ADLs with pain less than 2-3/10).   Pt will benefit from skilled therapeutic intervention in order to improve on the following deficits Pain;Decreased activity tolerance;Decreased range of motion   Rehab Potential Excellent   PT Frequency 2x / week   PT Duration 6 weeks   PT Treatment/Interventions Therapeutic exercise;Therapeutic activities;Manual techniques   PT Next Visit Plan Nustep with progression to stationary bike.  Left hip and knee exercise especially pain-free quadricep work and neuro-education/proprioception exercises.   Consulted and Agree with Plan of Care  Patient        Problem List Patient Active Problem List   Diagnosis Date Noted  . Patellar dislocation 01/25/2015    Evelene Croon, PTA 02/10/2015, 10:20 AM  [facility] [address] [place], [place], [address] Phone: [phone]   Fax:  [phone]

## 2015-02-16 ENCOUNTER — Ambulatory Visit: Payer: Managed Care, Other (non HMO) | Admitting: Physical Therapy

## 2015-02-16 DIAGNOSIS — M25562 Pain in left knee: Secondary | ICD-10-CM | POA: Diagnosis not present

## 2015-02-16 DIAGNOSIS — M25662 Stiffness of left knee, not elsewhere classified: Secondary | ICD-10-CM

## 2015-02-16 NOTE — Therapy (Signed)
[facility] [address] [place], [place], [address] Phone: [phone]   Fax:  [phone]  Physical Therapy Treatment  Patient Details  Name: Stacey Gonzalez MRN: [id number] Date of Birth: [birth year] Referring Provider:  Bennie Pierini, *  Encounter Date: 02/16/2015      PT End of Session - 02/16/15 0908    Visit Number 4   Number of Visits 12   Date for PT Re-Evaluation 03/24/15   PT Start Time 0905   PT Stop Time 0947   PT Time Calculation (min) 42 min   Activity Tolerance Patient tolerated treatment well   Behavior During Therapy [facility] for tasks assessed/performed      No past medical history on file.  No past surgical history on file.  There were no vitals filed for this visit.  Visit Diagnosis:  Knee stiffness, left  Left knee pain      Subjective Assessment - 02/16/15 0909    Subjective Patient reports no pain today.   Currently in Pain? No/denies                         [facility] Adult PT Treatment/Exercise - 02/16/15 0001    Knee/Hip Exercises: Stretches   Quad Stretch 2 reps;30 seconds  prone with strap   Knee/Hip Exercises: Aerobic   Nustep L5 x 12 min   Knee/Hip Exercises: Standing   Heel Raises 10 reps  3 way   Hip Abduction 3 sets;10 reps;Stengthening;Both   Lateral Step Up 15 reps;Hand Hold: 0;Step Height: 6"   Forward Step Up Left;15 reps;Hand Hold: 0;Step Height: 6"   Functional Squat 15 reps  pt reports some pull in quad   Rocker Board 3 minutes  no UE support   SLS level and compliant surface. Internittent touch with compkant surface                     PT Long Term Goals - 02/10/15 0834    PT LONG TERM GOAL #1   Title Ind with HEP.   Time 4   Period Weeks   Status Achieved   PT LONG TERM GOAL #2   Title Active left knee flexion= 115 degrees.   Time 4   Period Weeks   Status On-going   PT LONG TERM GOAL #3   Title Perform ADL's with pain not > 2-3/10.    Time 4   Period Weeks   Status Achieved   PT LONG TERM GOAL #4   Title Walk a community distance with pain not > 2-3/10.   Time 4   Period Weeks   Status On-going               Plan - 02/16/15 1310    Clinical Impression Statement Patient did well with therex today. She reported some tightness with functional squat. No reports of pain with step ups. Patient to return to work soon, so we worked on standing exercises today.        Problem List Patient Active Problem List   Diagnosis Date Noted  . Patellar dislocation 01/25/2015    Solon Palm PT  02/16/2015, 1:13 PM  [facility] [address] [place], [place], [address] Phone: [phone]   Fax:  [phone]

## 2015-02-16 NOTE — Patient Instructions (Signed)
  Quadriceps (Prone)   On stomach with sheet around ankles, knees together, hips down, pull heels toward bottom. Keep hips flat. Hold __30__ seconds. Repeat _3__ times. Do __3__ sessions per day. CAUTION: Stretch should be gentle, steady and slow.  Copyright  VHI. All rights reserved.    Solon Palm, PT [facility] [address] [place], [place], [address] Phone: [phone]   Fax:  [phone]

## 2015-02-17 ENCOUNTER — Ambulatory Visit: Payer: Managed Care, Other (non HMO) | Admitting: Physical Therapy

## 2015-02-17 ENCOUNTER — Encounter: Payer: Self-pay | Admitting: Physical Therapy

## 2015-02-17 DIAGNOSIS — M25562 Pain in left knee: Secondary | ICD-10-CM

## 2015-02-17 DIAGNOSIS — M25662 Stiffness of left knee, not elsewhere classified: Secondary | ICD-10-CM

## 2015-02-17 NOTE — Therapy (Signed)
[facility] [place], [place], [address] Phone: [phone]   Fax:  [phone]  Physical Therapy Treatment  Patient Details  Name: Stacey Gonzalez MRN: [id number] Date of Birth: 1995/10/02 Referring Provider:  Chevis Pretty, *  Encounter Date: 02/17/2015      PT End of Session - 02/17/15 1156    Visit Number 5   Number of Visits 12   Date for PT Re-Evaluation 03/24/15   PT Start Time 1117   PT Stop Time 1157   PT Time Calculation (min) 40 min   Activity Tolerance Patient tolerated treatment well   Behavior During Therapy [facility] for tasks assessed/performed      History reviewed. No pertinent past medical history.  History reviewed. No pertinent past surgical history.  There were no vitals filed for this visit.  Visit Diagnosis:  Knee stiffness, left  Left knee pain      Subjective Assessment - 02/17/15 1121    Subjective Patient has reported no pain today only soreness and has not returned to work yet.   How long can you stand comfortably? 30 minutes   Currently in Pain? No/denies                         [facility] Adult PT Treatment/Exercise - 02/17/15 0001    Knee/Hip Exercises: Aerobic   Nustep L5 x62mn   Knee/Hip Exercises: Standing   Terminal Knee Extension Limitations 3x10 with Glut set using pink XTS   Lateral Step Up Left;3 sets;10 reps;Step Height: 6"   Forward Step Up Left;3 sets;10 reps;Step Height: 6"   Rocker Board 3 minutes   Knee/Hip Exercises: Supine   Short Arc Quad Sets Strengthening;Left;3 sets;10 reps  with 4# adduction squeeze for VMO activation   Straight Leg Raises Strengthening;Left;3 sets;10 reps  slight ER   Knee/Hip Exercises: Sidelying   Hip ABduction Strengthening;Left;2 sets;10 reps                     PT Long Term Goals - 02/17/15 1140    PT LONG TERM GOAL #1   Title Ind with HEP.   Time 4   Period Weeks   Status Achieved   PT LONG  TERM GOAL #2   Title Active left knee flexion= 115 degrees.   Time 4   Period Weeks   Status Achieved  AROM 115   PT LONG TERM GOAL #3   Title Perform ADL's with pain not > 2-3/10.   Time 4   Period Weeks   Status Achieved   PT LONG TERM GOAL #4   Title Walk a community distance with pain not > 2-3/10.   Time 4   Period Weeks   Status Achieved  2-3/10 at most with communitiy distance               Plan - 02/17/15 1149    Clinical Impression Statement Patient continues to progress with all activities. Patient has had no episodes of knee cap popping out of place and able to walk community distance with no more than 2-3/10 pain. AROM 115 degrees today. Patient has met all current goals yet woud like to continue therapy for a week or two for strength. sending follow up to MPT to update goals.   Pt will benefit from skilled therapeutic intervention in order to improve on the following deficits Pain;Decreased activity tolerance;Decreased range of motion   Rehab Potential Excellent   PT  Frequency 2x / week   PT Duration 6 weeks   PT Treatment/Interventions Therapeutic exercise;Therapeutic activities;Manual techniques   PT Next Visit Plan cont per MPT for Nustep with progression to stationary bike.  Left hip and knee exercise especially pain-free quadricep work and neuro-education/proprioception exercises (MD. Arther Abbott 03/08/15)   Consulted and Agree with Plan of Care Patient        Problem List Patient Active Problem List   Diagnosis Date Noted  . Patellar dislocation 01/25/2015   Ladean Raya, PTA 02/17/2015 11:58 AM DUNFORD, Venetia Maxon, PTA 02/17/2015, 11:56 AM  [facility] [address] [place], [place], [address] Phone: [phone]   Fax:  [phone]

## 2015-02-17 NOTE — Therapy (Signed)
[facility] [place], [place], [address] Phone: [phone]   Fax:  [phone]  Physical Therapy Treatment  Patient Details  Name: DEVINE DANT MRN: [id number] Date of Birth: 03/21/1996 Referring Provider:  Chevis Pretty, *  Encounter Date: 02/17/2015      PT End of Session - 02/17/15 1156    Visit Number 5   Number of Visits 12   Date for PT Re-Evaluation 03/24/15   PT Start Time 1117   PT Stop Time 1157   PT Time Calculation (min) 40 min   Activity Tolerance Patient tolerated treatment well   Behavior During Therapy [facility] for tasks assessed/performed      History reviewed. No pertinent past medical history.  History reviewed. No pertinent past surgical history.  There were no vitals filed for this visit.  Visit Diagnosis:  Knee stiffness, left  Left knee pain      Subjective Assessment - 02/17/15 1121    Subjective Patient has reported no pain today only soreness and has not returned to work yet.   How long can you stand comfortably? 30 minutes   Currently in Pain? No/denies            [facility] PT Assessment - 02/17/15 0001    AROM   Overall AROM  Within functional limits for tasks performed   Overall AROM Comments AROM 115 degrees left knee flexion                     [facility] Adult PT Treatment/Exercise - 02/17/15 0001    Knee/Hip Exercises: Aerobic   Nustep L5 x21mn   Knee/Hip Exercises: Standing   Terminal Knee Extension Limitations 3x10 with Glut set using pink XTS   Lateral Step Up Left;3 sets;10 reps;Step Height: 6"   Forward Step Up Left;3 sets;10 reps;Step Height: 6"   Rocker Board 3 minutes   Other Standing Knee Exercises 4" heel dot 2x10   Knee/Hip Exercises: Supine   Short Arc Quad Sets Strengthening;Left;3 sets;10 reps  with 4# adduction squeeze for VMO activation   Straight Leg Raises Strengthening;Left;3 sets;10 reps  slight ER   Knee/Hip Exercises: Sidelying    Hip ABduction Strengthening;Left;2 sets;10 reps                     PT Long Term Goals - 02/17/15 1140    PT LONG TERM GOAL #1   Title Ind with HEP.   Time 4   Period Weeks   Status Achieved   PT LONG TERM GOAL #2   Title Active left knee flexion= 115 degrees.   Time 4   Period Weeks   Status Achieved  AROM 115   PT LONG TERM GOAL #3   Title Perform ADL's with pain not > 2-3/10.   Time 4   Period Weeks   Status Achieved   PT LONG TERM GOAL #4   Title Walk a community distance with pain not > 2-3/10.   Time 4   Period Weeks   Status Achieved  2-3/10 at most with communitiy distance   PT LONG TERM GOAL #5   Title Perform all  ADL's and work activites without pain   Time 2   Period Weeks   Status New               Plan - 02/17/15 1149    Clinical Impression Statement Patient continues to progress with all activities. Patient has had no episodes of  knee cap popping out of place and able to walk community distance with no more than 2-3/10 pain. AROM 115 degrees today. Patient has met all current goals yet woud like to continue therapy for a week or two for strength. sending follow up to MPT to update goals.   Pt will benefit from skilled therapeutic intervention in order to improve on the following deficits Pain;Decreased activity tolerance;Decreased range of motion   Rehab Potential Excellent   PT Frequency 2x / week   PT Duration 6 weeks   PT Treatment/Interventions Therapeutic exercise;Therapeutic activities;Manual techniques   PT Next Visit Plan cont per MPT for Nustep with progression to stationary bike.  Left hip and knee exercise especially pain-free quadricep work and neuro-education/proprioception exercises (MD. Arther Abbott 03/08/15)   Consulted and Agree with Plan of Care Patient        Problem List Patient Active Problem List   Diagnosis Date Noted  . Patellar dislocation 01/25/2015    APPLEGATE, [place] MPT 02/17/2015, 1:05  PM  [facility] [address] [place], [place], [address] Phone: [phone]   Fax:  [phone]

## 2015-02-23 ENCOUNTER — Ambulatory Visit: Payer: Managed Care, Other (non HMO) | Attending: Orthopedic Surgery | Admitting: Physical Therapy

## 2015-02-23 ENCOUNTER — Encounter: Payer: Self-pay | Admitting: Physical Therapy

## 2015-02-23 DIAGNOSIS — M25562 Pain in left knee: Secondary | ICD-10-CM | POA: Insufficient documentation

## 2015-02-23 DIAGNOSIS — M25662 Stiffness of left knee, not elsewhere classified: Secondary | ICD-10-CM | POA: Diagnosis not present

## 2015-02-23 NOTE — Therapy (Signed)
[facility] [address] [place], [place], [address] Phone: [phone]   Fax:  [phone]  Physical Therapy Treatment  Patient Details  Name: Stacey Gonzalez MRN: [id number] Date of Birth: 10/18/1995 Referring Provider:  Bennie Pierini, *  Encounter Date: 02/23/2015      PT End of Session - 02/23/15 1404    Visit Number 6   Number of Visits 12   Date for PT Re-Evaluation 03/24/15   PT Start Time 1400   PT Stop Time 1441   PT Time Calculation (min) 41 min   Activity Tolerance Patient tolerated treatment well   Behavior During Therapy [facility] for tasks assessed/performed      History reviewed. No pertinent past medical history.  History reviewed. No pertinent past surgical history.  There were no vitals filed for this visit.  Visit Diagnosis:  Knee stiffness, left  Left knee pain      Subjective Assessment - 02/23/15 1403    Subjective Patient has reported no pain today only soreness and has not returned to work yet.   How long can you stand comfortably? 30 minutes   Currently in Pain? No/denies                         [facility] Adult PT Treatment/Exercise - 02/23/15 0001    Knee/Hip Exercises: Aerobic   Nustep L5 x1min   Knee/Hip Exercises: Standing   Terminal Knee Extension Limitations 3x10 with Glut set using pink XTS   Lateral Step Up Left;3 sets;10 reps;Step Height: 6"   Forward Step Up Left;3 sets;10 reps;Step Height: 6"   Rocker Board 3 minutes   Other Standing Knee Exercises 4" heel dot 2x10   Knee/Hip Exercises: Supine   Short Arc Quad Sets Strengthening;Left;3 sets;10 reps  4# SAQ and 4# add squeeze for VMO activation   Straight Leg Raises --   Straight Leg Raise with External Rotation Strengthening;Left;3 sets;10 reps   Knee/Hip Exercises: Sidelying   Hip ABduction Strengthening;Left;2 sets;10 reps                     PT Long Term Goals - 02/23/15 1405    PT LONG TERM GOAL  #1   Title Ind with HEP.   Time 4   Period Weeks   Status Achieved   PT LONG TERM GOAL #2   Title Active left knee flexion= 115 degrees.   Time 4   Period Weeks   Status Achieved   PT LONG TERM GOAL #3   Title Perform ADL's with pain not > 2-3/10.   Time 4   Period Weeks   Status Achieved   PT LONG TERM GOAL #4   Title Walk a community distance with pain not > 2-3/10.   Time 4   Period Weeks   PT LONG TERM GOAL #5   Title Perform all  ADL's and work activites without pain   Time 2   Period Weeks   Status On-going  has not returned to work               Plan - 02/23/15 1407    Clinical Impression Statement Patient continues to progress with no pain and no episodes of knee cap popping out of place. patient has not returned to work at this time and is to continue with strengthening per MPT. Goal ongoing due to not returning to work. No antalgic gait today.   Pt will benefit from  skilled therapeutic intervention in order to improve on the following deficits Pain;Decreased activity tolerance;Decreased range of motion   Rehab Potential Excellent   PT Frequency 2x / week   PT Duration 6 weeks   PT Treatment/Interventions Therapeutic exercise;Therapeutic activities;Manual techniques   PT Next Visit Plan cont per MPT for Nustep with progression to stationary bike.  Left hip and knee exercise especially pain-free quadricep work and neuro-education/proprioception exercises (MD. Fuller Canada 03/08/15)   Consulted and Agree with Plan of Care Patient        Problem List Patient Active Problem List   Diagnosis Date Noted  . Patellar dislocation 01/25/2015    Hermelinda Dellen, PTA 02/23/2015, 2:41 PM  [facility] [address] [place], [place], [address] Phone: [phone]   Fax:  [phone]

## 2015-02-24 ENCOUNTER — Encounter: Payer: Managed Care, Other (non HMO) | Admitting: Physical Therapy

## 2015-03-02 ENCOUNTER — Encounter: Payer: Self-pay | Admitting: Physical Therapy

## 2015-03-02 ENCOUNTER — Ambulatory Visit: Payer: Managed Care, Other (non HMO) | Admitting: Physical Therapy

## 2015-03-02 DIAGNOSIS — M25662 Stiffness of left knee, not elsewhere classified: Secondary | ICD-10-CM | POA: Diagnosis not present

## 2015-03-02 DIAGNOSIS — M25562 Pain in left knee: Secondary | ICD-10-CM

## 2015-03-02 NOTE — Therapy (Signed)
[facility] [address] [place], [place], [address] Phone: [phone]   Fax:  [phone]  Physical Therapy Treatment  Patient Details  Name: Stacey Gonzalez MRN: [id number] Date of Birth: 03/17/1996 Referring Provider:  Bennie Pierini, *  Encounter Date: 03/02/2015      PT End of Session - 03/02/15 0931    Visit Number 7   Number of Visits 12   Date for PT Re-Evaluation 03/24/15   PT Start Time 0859   PT Stop Time 0940   PT Time Calculation (min) 41 min   Activity Tolerance Patient tolerated treatment well   Behavior During Therapy [facility] for tasks assessed/performed      History reviewed. No pertinent past medical history.  History reviewed. No pertinent past surgical history.  There were no vitals filed for this visit.  Visit Diagnosis:  Knee stiffness, left  Left knee pain      Subjective Assessment - 03/02/15 0909    Subjective Patient has reported no pain today only soreness and has not returned to work but is on the schedule to return to work this coming weekend.   How long can you stand comfortably? 30 minutes   Currently in Pain? No/denies                         [facility] Adult PT Treatment/Exercise - 03/02/15 0001    Knee/Hip Exercises: Aerobic   Elliptical x66min L5R2   Nustep L 6 x51min LE only   Knee/Hip Exercises: Standing   Terminal Knee Extension Limitations 4x10 with Glut set using pink XTS   Lateral Step Up Left;3 sets;10 reps;Step Height: 8"   Forward Step Up Left;3 sets;10 reps;Step Height: 8"   Other Standing Knee Exercises 2# ball toss on rebounder w SLS on left 5x10   Knee/Hip Exercises: Seated   Long Arc Quad Strengthening;Left;3 sets;10 reps  4# ankle wt with 2# ball squeeze for VMO activation 3x10                     PT Long Term Goals - 02/23/15 1405    PT LONG TERM GOAL #1   Title Ind with HEP.   Time 4   Period Weeks   Status Achieved   PT LONG TERM  GOAL #2   Title Active left knee flexion= 115 degrees.   Time 4   Period Weeks   Status Achieved   PT LONG TERM GOAL #3   Title Perform ADL's with pain not > 2-3/10.   Time 4   Period Weeks   Status Achieved   PT LONG TERM GOAL #4   Title Walk a community distance with pain not > 2-3/10.   Time 4   Period Weeks   PT LONG TERM GOAL #5   Title Perform all  ADL's and work activites without pain   Time 2   Period Weeks   Status On-going  has not returned to work               Plan - 03/02/15 0937    Clinical Impression Statement Patient progressing with exercises with increased standing activities with no pain or episodes at this time. Patient will return to work this coming weekend. Goal ongoing at this time due to not returned to work. No antalgic gait.   Pt will benefit from skilled therapeutic intervention in order to improve on the following deficits Pain;Decreased activity tolerance;Decreased range of motion  Rehab Potential Excellent   PT Frequency 2x / week   PT Duration 6 weeks   PT Treatment/Interventions Therapeutic exercise;Therapeutic activities;Manual techniques   PT Next Visit Plan cont with POC with standing and sitting strengthening to prepare for work   [employer], [employer] and CompanyConsulted and Agree with Plan of Care Patient        Problem List Patient Active Problem List   Diagnosis Date Noted  . Patellar dislocation 01/25/2015    Hermelinda DellenDUNFORD, CHRISTINA P, PTA 03/02/2015, 9:42 AM  [facility] [address] [place], [place], [id number] Phone: [phone]   Fax:  [phone]

## 2015-03-03 ENCOUNTER — Encounter: Payer: Managed Care, Other (non HMO) | Admitting: Physical Therapy

## 2015-03-08 ENCOUNTER — Ambulatory Visit (INDEPENDENT_AMBULATORY_CARE_PROVIDER_SITE_OTHER): Payer: Managed Care, Other (non HMO) | Admitting: Orthopedic Surgery

## 2015-03-08 ENCOUNTER — Encounter: Payer: Self-pay | Admitting: Orthopedic Surgery

## 2015-03-08 VITALS — BP 133/83 | Ht 65.0 in | Wt 256.0 lb

## 2015-03-08 DIAGNOSIS — S83005S Unspecified dislocation of left patella, sequela: Secondary | ICD-10-CM

## 2015-03-08 NOTE — Progress Notes (Signed)
Patient ID: Stacey Gonzalez, female   DOB: 02/05/1996, 19 y.o.   MRN: [id number]  Follow up visit  Chief Complaint  Patient presents with  . Follow-up    6 week follow up left knee patellar dislocation, s/p therapy, DOI 01/01/15    BP 133/83 mmHg  Ht 5\' 5"  (1.651 m)  Wt 256 lb (116.121 kg)  BMI 42.60 kg/m2  Encounter Diagnosis  Name Primary?  . Patellar dislocation, left, sequela Yes    The patient had a patellar dislocation August 14 she was treated with bracing and physical therapy comes in with minimal complaints no instability episodes  Physical therapy completed  Review of systems negative for pain swelling or giving way  Examination shows intact straight leg raise full range of motion as been restored is no tenderness. The patella feels stable although she has chronic patellar laxity sensation and neurovascular exam is intact patient is ambulatory without any assistive devices  Follow-up in 3 months continue bracing at work and for long periods of standing walking

## 2015-03-08 NOTE — Patient Instructions (Addendum)
Finish therapy then continue at home x 2 months   Brace for work and any walking greater than 1 hour

## 2015-03-09 ENCOUNTER — Encounter: Payer: Managed Care, Other (non HMO) | Admitting: Physical Therapy

## 2015-03-14 NOTE — Therapy (Signed)
[facility] [place], [place], [address] Phone: [phone]   Fax:  [phone]  Physical Therapy Treatment  Patient Details  Name: Stacey Gonzalez MRN: [id number] Date of Birth: [birth year] No Data Recorded  Encounter Date: 03/02/2015    History reviewed. No pertinent past medical history.  History reviewed. No pertinent past surgical history.  There were no vitals filed for this visit.  Visit Diagnosis:  Knee stiffness, left  Left knee pain                                    PT Long Term Goals - 02/23/15 1405    PT LONG TERM GOAL #1   Title Ind with HEP.   Time 4   Period Weeks   Status Achieved   PT LONG TERM GOAL #2   Title Active left knee flexion= 115 degrees.   Time 4   Period Weeks   Status Achieved   PT LONG TERM GOAL #3   Title Perform ADL's with pain not > 2-3/10.   Time 4   Period Weeks   Status Achieved   PT LONG TERM GOAL #4   Title Walk a community distance with pain not > 2-3/10.   Time 4   Period Weeks   PT LONG TERM GOAL #5   Title Perform all  ADL's and work activites without pain   Time 2   Period Weeks   Status On-going  has not returned to work               Problem List Patient Active Problem List   Diagnosis Date Noted  . Patellar dislocation 01/25/2015  PHYSICAL THERAPY DISCHARGE SUMMARY  Visits from Start of Care: 7  Current functional level related to goals / functional outcomes: Excellent progress and nearly all goals met.   Remaining deficits: Goal #5 not met only because she has not returned to work at this point.   Education / Equipment: HEP. Plan: Patient agrees to discharge.  Patient goals were not met. Patient is being discharged due to meeting the stated rehab goals.  ?????       Wilman Tucker, [place] MPT 03/14/2015, 8:59 AM  [facility] [address] [place], [place],  [address] Phone: [phone]   Fax:  [phone]  Name: Stacey Gonzalez MRN: [id number] Date of Birth: [birth year]

## 2015-03-21 NOTE — Therapy (Signed)
[facility] [place], [place], [address] Phone: [phone]   Fax:  [phone]  Physical Therapy Treatment  Patient Details  Name: Stacey Gonzalez MRN: [id number] Date of Birth: 08/01/1995 No Data Recorded  Encounter Date: 03/02/2015    History reviewed. No pertinent past medical history.  History reviewed. No pertinent past surgical history.  There were no vitals filed for this visit.  Visit Diagnosis:  Knee stiffness, left  Left knee pain                                    PT Long Term Goals - 02/23/15 1405    PT LONG TERM GOAL #1   Title Ind with HEP.   Time 4   Period Weeks   Status Achieved   PT LONG TERM GOAL #2   Title Active left knee flexion= 115 degrees.   Time 4   Period Weeks   Status Achieved   PT LONG TERM GOAL #3   Title Perform ADL's with pain not > 2-3/10.   Time 4   Period Weeks   Status Achieved   PT LONG TERM GOAL #4   Title Walk a community distance with pain not > 2-3/10.   Time 4   Period Weeks   PT LONG TERM GOAL #5   Title Perform all  ADL's and work activites without pain   Time 2   Period Weeks   Status On-going  has not returned to work               Problem List Patient Active Problem List   Diagnosis Date Noted  . Patellar dislocation 01/25/2015   PHYSICAL THERAPY DISCHARGE SUMMARY  Visits from Start of Care: 8  Current functional level related to goals / functional outcomes: Please see above.   Remaining deficits: Please see goal #5 above.   Education / Equipment: HEP. Plan: Patient agrees to discharge.  Patient goals were partially met. Patient is being discharged due to meeting the stated rehab goals.  ?????       Tomicka Lover, [place] MPT 03/21/2015, 4:53 PM  [facility] [address] [place], [place], [address] Phone: [phone]   Fax:  [phone]  Name: Stacey Gonzalez MRN: [id number] Date of Birth: 13-Aug-1995

## 2015-06-09 ENCOUNTER — Ambulatory Visit: Payer: Managed Care, Other (non HMO) | Admitting: Orthopedic Surgery

## 2015-08-03 NOTE — Therapy (Signed)
[facility] [place], [place], [address] Phone: [phone]   Fax:  [phone]  Physical Therapy Treatment  Patient Details  Name: Stacey Gonzalez MRN: [id number] Date of Birth: [birth year] No Data Recorded  Encounter Date: 03/02/2015    History reviewed. No pertinent past medical history.  History reviewed. No pertinent past surgical history.  There were no vitals filed for this visit.  Visit Diagnosis:  Knee stiffness, left  Left knee pain                                    PT Long Term Goals - 02/23/15 1405    PT LONG TERM GOAL #1   Title Ind with HEP.   Time 4   Period Weeks   Status Achieved   PT LONG TERM GOAL #2   Title Active left knee flexion= 115 degrees.   Time 4   Period Weeks   Status Achieved   PT LONG TERM GOAL #3   Title Perform ADL's with pain not > 2-3/10.   Time 4   Period Weeks   Status Achieved   PT LONG TERM GOAL #4   Title Walk a community distance with pain not > 2-3/10.   Time 4   Period Weeks   PT LONG TERM GOAL #5   Title Perform all  ADL's and work activites without pain   Time 2   Period Weeks   Status On-going  has not returned to work               Problem List Patient Active Problem List   Diagnosis Date Noted  . Patellar dislocation 01/25/2015  PHYSICAL THERAPY DISCHARGE SUMMARY  Visits from Start of Care: 7  Current functional level related to goals / functional outcomes: Please see above.   Remaining deficits: Goals essentially met.   Education / Equipment: HEP.  Plan: Patient agrees to discharge.  Patient goals were met. Patient is being discharged due to meeting the stated rehab goals.  ?????       Nehemias Sauceda, [place] MPT 08/03/2015, 6:16 PM  [facility] [place], [place], [address] Phone: [phone]   Fax:  [phone]  Name: Stacey Gonzalez MRN:  [id number] Date of Birth: 1996-04-21

## 2015-10-04 ENCOUNTER — Telehealth: Payer: Self-pay | Admitting: Nurse Practitioner

## 2015-10-20 NOTE — Telephone Encounter (Signed)
Pt declined appt at this time 

## 2015-12-13 ENCOUNTER — Ambulatory Visit (INDEPENDENT_AMBULATORY_CARE_PROVIDER_SITE_OTHER): Payer: 59 | Admitting: Family

## 2015-12-13 ENCOUNTER — Encounter: Payer: Self-pay | Admitting: Family

## 2015-12-13 VITALS — BP 137/69 | HR 170 | Temp 100.0°F | Ht 65.0 in | Wt 256.0 lb

## 2015-12-13 DIAGNOSIS — R000 Tachycardia, unspecified: Secondary | ICD-10-CM | POA: Diagnosis not present

## 2015-12-13 DIAGNOSIS — M545 Low back pain, unspecified: Secondary | ICD-10-CM

## 2015-12-13 DIAGNOSIS — N3000 Acute cystitis without hematuria: Secondary | ICD-10-CM

## 2015-12-13 LAB — MICROSCOPIC EXAMINATION

## 2015-12-13 LAB — URINALYSIS, COMPLETE
Bilirubin, UA: NEGATIVE
GLUCOSE, UA: NEGATIVE
NITRITE UA: NEGATIVE
RBC, UA: NEGATIVE
Specific Gravity, UA: >1.03 — ABNORMAL HIGH (ref 1.005–1.030)
UUROB: 0.2 mg/dL (ref 0.2–1.0)
pH, UA: 5.5 (ref 5.0–7.5)

## 2015-12-13 MED ORDER — SULFAMETHOXAZOLE-TRIMETHOPRIM 800-160 MG PO TABS: 1.0000 | ORAL_TABLET | Freq: Two times a day (BID) | ORAL | 0 refills | Status: DC

## 2015-12-13 NOTE — Patient Instructions (Addendum)
Asymptomatic Bacteriuria, Female Asymptomatic bacteriuria is the presence of a large number of bacteria in your urine without the usual symptoms of burning or frequent urination. The following conditions increase the risk of asymptomatic bacteriuria:  Diabetes mellitus.  Advanced age.  Pregnancy in the first trimester.  Kidney stones.  Kidney transplants.  Leaky kidney tube valve in young children (reflux). Treatment for this condition is not needed in most people and can lead to other problems such as too much yeast and growth of resistant bacteria. However, some people, such as pregnant women, do need treatment to prevent kidney infection. Asymptomatic bacteriuria in pregnancy is also associated with fetal growth restriction, premature labor, and newborn death. HOME CARE INSTRUCTIONS Monitor your condition for any changes. The following actions may help to relieve any discomfort you are feeling:  Drink enough water and fluids to keep your urine clear or pale yellow. Go to the bathroom more often to keep your bladder empty.  Keep the area around your vagina and rectum clean. Wipe yourself from front to back after urinating. SEEK IMMEDIATE MEDICAL CARE IF:  You develop signs of an infection such as:  Burning with urination.  Frequency of voiding.  Back pain.  Fever.  You have blood in the urine.  You develop a fever. MAKE SURE YOU:  Understand these instructions.  Will watch your condition.  Will get help right away if you are not doing well or get worse.   This information is not intended to replace advice given to you by your health care provider. Make sure you discuss any questions you have with your health care provider.   Document Released: 05/07/2005 Document Revised: 05/28/2014 Document Reviewed: 10/27/2012 Elsevier Interactive Patient Education 2016 Elsevier Inc.  

## 2015-12-13 NOTE — Progress Notes (Signed)
   Subjective:    Patient ID: Stacey Gonzalez, female    DOB: 29-Apr-1996, 20 y.o.   MRN: [id number]  Pt presents to the office today for acute back pain.    Back Pain  This is a new problem. The current episode started in the past 7 days. The problem has been gradually worsening since onset. The pain is present in the lumbar spine. The quality of the pain is described as aching. The pain is at a severity of 5/10. The pain is moderate. The symptoms are aggravated by lying down. Stiffness is present at night. Associated symptoms include a fever. Pertinent negatives include no bladder incontinence, bowel incontinence, chest pain, dysuria, leg pain, numbness, tingling or weakness. Risk factors include obesity. She has tried NSAIDs for the symptoms. The treatment provided mild relief.      Review of Systems  Constitutional: Positive for fever.  Respiratory: Negative for chest tightness and shortness of breath.   Cardiovascular: Negative for chest pain.  Gastrointestinal: Negative for bowel incontinence, constipation, diarrhea and nausea.  Genitourinary: Negative for bladder incontinence and dysuria.  Musculoskeletal: Positive for back pain.  Neurological: Negative for tingling, weakness and numbness.  All other systems reviewed and are negative.      Objective:   Physical Exam  Constitutional: She is oriented to person, place, and time. She appears well-developed and well-nourished. No distress.  HENT:  Head: Normocephalic and atraumatic.  Eyes: Pupils are equal, round, and reactive to light.  Neck: Normal range of motion. Neck supple. No thyromegaly present.  Cardiovascular: Regular rhythm, normal heart sounds and intact distal pulses.  Tachycardia present.   No murmur heard. Pulmonary/Chest: Effort normal and breath sounds normal. No respiratory distress. She has no wheezes.  Abdominal: Soft. Bowel sounds are normal. She exhibits no distension. There is no tenderness.  Musculoskeletal:  Normal range of motion. She exhibits no edema or tenderness.  Mild CVA tenderness bilaterally  Neurological: She is alert and oriented to person, place, and time.  Skin: Skin is warm and dry.  Psychiatric: She has a normal mood and affect. Her behavior is normal. Judgment and thought content normal.  Vitals reviewed.  BP 137/69   Pulse (!) 170   Temp 100 F (37.8 C) (Oral)   Ht 5\' 5"  (1.651 m)   Wt 256 lb (116.1 kg)   BMI 42.60 kg/m   EKG- Sinus Tachycardia     Assessment & Plan:  1. Tachycardia -Avoid caffeine  - EKG 12-Lead  2. Bilateral low back pain without sciatica - Urinalysis, Complete  3. Acute cystitis without hematuria -Force fluids AZO over the counter X2 days RTO prn Culture pending - sulfamethoxazole-trimethoprim (BACTRIM DS) 800-160 MG tablet; Take 1 tablet by mouth 2 (two) times daily.  Dispense: 14 tablet; Refill: 0 - Urine culture  Jannifer Rodney, FNP

## 2015-12-15 LAB — URINE CULTURE

## 2015-12-26 ENCOUNTER — Encounter: Payer: Self-pay | Admitting: Family

## 2015-12-26 ENCOUNTER — Ambulatory Visit (INDEPENDENT_AMBULATORY_CARE_PROVIDER_SITE_OTHER): Payer: 59 | Admitting: Family

## 2015-12-26 VITALS — BP 127/86 | HR 113 | Temp 97.7°F | Ht 65.0 in | Wt 263.2 lb

## 2015-12-26 DIAGNOSIS — Z139 Encounter for screening, unspecified: Secondary | ICD-10-CM

## 2015-12-26 DIAGNOSIS — R000 Tachycardia, unspecified: Secondary | ICD-10-CM | POA: Diagnosis not present

## 2015-12-26 NOTE — Progress Notes (Addendum)
Subjective:    Patient ID: Stacey Gonzalez, female    DOB: 08-23-1995, 20 y.o.   MRN: [id number]   HPI PT presents to the office today for TB vaccine and immunization record. PT has tachycardia today. Pt was seen in the office on 12/13/15 with UTI and had tachycardia, but had an normal EKG with Sinus  Tachycardia. PT denies any anxiety, stress, fever, or caffeine.   Review of Systems  Constitutional: Negative.   HENT: Negative.   Eyes: Negative.   Respiratory: Negative.  Negative for apnea, chest tightness, shortness of breath and wheezing.   Cardiovascular: Negative.  Negative for chest pain and leg swelling.  Gastrointestinal: Negative.   Endocrine: Negative.   Genitourinary: Negative.   Musculoskeletal: Negative.   Neurological: Negative.  Negative for headaches.  Hematological: Negative.   Psychiatric/Behavioral: Negative.   All other systems reviewed and are negative.      Objective:   Physical Exam  Constitutional: She is oriented to person, place, and time. She appears well-developed and well-nourished. No distress.  HENT:  Head: Normocephalic and atraumatic.  Eyes: Pupils are equal, round, and reactive to light.  Neck: Normal range of motion. Neck supple. No thyromegaly present.  Cardiovascular: Regular rhythm, normal heart sounds and intact distal pulses.  Tachycardia present.   No murmur heard. Pulmonary/Chest: Effort normal and breath sounds normal. No respiratory distress. She has no wheezes.  Abdominal: Soft. Bowel sounds are normal. She exhibits no distension. There is no tenderness.  Musculoskeletal: Normal range of motion. She exhibits no edema or tenderness.  Neurological: She is alert and oriented to person, place, and time. She has normal reflexes. No cranial nerve deficit.  Skin: Skin is warm and dry.  Psychiatric: She has a normal mood and affect. Her behavior is normal. Judgment and thought content normal.  Vitals reviewed.     BP 127/86   Pulse (!)  113   Temp 97.7 F (36.5 C) (Oral)   Ht 5' 5" (1.651 m)   Wt 263 lb 3.2 oz (119.4 kg)   BMI 43.80 kg/m      Assessment & Plan:  1. Tachycardia - CMP14+EGFR - Thyroid Panel With TSH - Holter monitor - 24 hour; Future  2. Screening - TB Skin Test - CMP14+EGFR  Avoid caffeine  Stress management  RTO 1 month  Pt left without getting blood work or holter monitor. Will call pt and tell her to get blood work when she gets TB rechecked. Will hold off on monitor at this time per pt request.   Evelina Dun, FNP

## 2015-12-26 NOTE — Addendum Note (Signed)
Addended by: Jannifer RodneyHAWKS, CHRISTY A on: 12/26/2015 10:06 AM   Modules accepted: Orders

## 2015-12-26 NOTE — Patient Instructions (Signed)
Nonspecific Tachycardia Tachycardia is a faster than normal heartbeat (more than 100 beats per minute). In adults, the heart normally beats between 60 and 100 times a minute. A fast heartbeat may be a normal response to exercise or stress. It does not necessarily mean that something is wrong. However, sometimes when your heart beats too fast it may not be able to pump enough blood to the rest of your body. This can result in chest pain, shortness of breath, dizziness, and even fainting. Nonspecific tachycardia means that the specific cause or pattern of your tachycardia is unknown. CAUSES  Tachycardia may be harmless or it may be due to a more serious underlying cause. Possible causes of tachycardia include:  Exercise or exertion.  Fever.  Pain or injury.  Infection.  Loss of body fluids (dehydration).  Overactive thyroid.  Lack of red blood cells (anemia).  Anxiety and stress.  Alcohol.  Caffeine.  Tobacco products.  Diet pills.  Illegal drugs.  Heart disease. SYMPTOMS  Rapid or irregular heartbeat (palpitations).  Suddenly feeling your heart beating (cardiac awareness).  Dizziness.  Tiredness (fatigue).  Shortness of breath.  Chest pain.  Nausea.  Fainting. DIAGNOSIS  Your caregiver will perform a physical exam and take your medical history. In some cases, a heart specialist (cardiologist) may be consulted. Your caregiver may also order:  Blood tests.  Electrocardiography. This test records the electrical activity of your heart.  A heart monitoring test. TREATMENT  Treatment will depend on the likely cause of your tachycardia. The goal is to treat the underlying cause of your tachycardia. Treatment methods may include:  Replacement of fluids or blood through an intravenous (IV) tube for moderate to severe dehydration or anemia.  New medicines or changes in your current medicines.  Diet and lifestyle changes.  Treatment for certain  infections.  Stress relief or relaxation methods. HOME CARE INSTRUCTIONS   Rest.  Drink enough fluids to keep your urine clear or pale yellow.  Do not smoke.  Avoid:  Caffeine.  Tobacco.  Alcohol.  Chocolate.  Stimulants such as over-the-counter diet pills or pills that help you stay awake.  Situations that cause anxiety or stress.  Illegal drugs such as marijuana, phencyclidine (PCP), and cocaine.  Only take medicine as directed by your caregiver.  Keep all follow-up appointments as directed by your caregiver. SEEK IMMEDIATE MEDICAL CARE IF:   You have pain in your chest, upper arms, jaw, or neck.  You become weak, dizzy, or feel faint.  You have palpitations that will not go away.  You vomit, have diarrhea, or pass blood in your stool.  Your skin is cool, pale, and wet.  You have a fever that will not go away with rest, fluids, and medicine. MAKE SURE YOU:   Understand these instructions.  Will watch your condition.  Will get help right away if you are not doing well or get worse.   This information is not intended to replace advice given to you by your health care provider. Make sure you discuss any questions you have with your health care provider.   Document Released: 06/14/2004 Document Revised: 07/30/2011 Document Reviewed: 11/19/2014 Elsevier Interactive Patient Education 2016 Elsevier Inc.  

## 2015-12-28 ENCOUNTER — Other Ambulatory Visit: Payer: 59

## 2015-12-28 LAB — TB SKIN TEST
INDURATION: 0 mm
TB Skin Test: NEGATIVE

## 2015-12-29 LAB — THYROID PANEL WITH TSH
Free Thyroxine Index: 1.8 (ref 1.2–4.9)
T3 Uptake Ratio: 23 % — ABNORMAL LOW (ref 24–39)
T4 TOTAL: 8 ug/dL (ref 4.5–12.0)
TSH: 1.55 u[IU]/mL (ref 0.450–4.500)

## 2015-12-29 LAB — CMP14+EGFR
A/G RATIO: 1.4 (ref 1.2–2.2)
ALK PHOS: 95 IU/L (ref 39–117)
ALT: 15 IU/L (ref 0–32)
AST: 18 IU/L (ref 0–40)
Albumin: 4.2 g/dL (ref 3.5–5.5)
BUN/Creatinine Ratio: 15 (ref 9–23)
BUN: 12 mg/dL (ref 6–20)
Bilirubin Total: 0.4 mg/dL (ref 0.0–1.2)
CO2: 20 mmol/L (ref 18–29)
Calcium: 9.4 mg/dL (ref 8.7–10.2)
Chloride: 103 mmol/L (ref 96–106)
Creatinine, Ser: 0.81 mg/dL (ref 0.57–1.00)
GFR calc Af Amer: 119 mL/min/{1.73_m2} (ref >59)
GFR calc non Af Amer: 103 mL/min/{1.73_m2} (ref >59)
GLOBULIN, TOTAL: 3 g/dL (ref 1.5–4.5)
GLUCOSE: 86 mg/dL (ref 65–99)
POTASSIUM: 4.3 mmol/L (ref 3.5–5.2)
SODIUM: 139 mmol/L (ref 134–144)
Total Protein: 7.2 g/dL (ref 6.0–8.5)

## 2016-01-03 ENCOUNTER — Telehealth: Payer: Self-pay | Admitting: Family

## 2016-01-03 DIAGNOSIS — Z139 Encounter for screening, unspecified: Secondary | ICD-10-CM

## 2016-01-03 NOTE — Telephone Encounter (Signed)
Pt to come in for titers

## 2016-01-06 ENCOUNTER — Ambulatory Visit: Payer: 59

## 2016-01-06 ENCOUNTER — Other Ambulatory Visit: Payer: 59

## 2017-01-31 ENCOUNTER — Ambulatory Visit (INDEPENDENT_AMBULATORY_CARE_PROVIDER_SITE_OTHER): Payer: 59 | Admitting: Licensed Clinical Social Worker

## 2017-01-31 ENCOUNTER — Telehealth: Payer: Self-pay | Admitting: *Deleted

## 2017-01-31 DIAGNOSIS — F320 Major depressive disorder, single episode, mild: Secondary | ICD-10-CM

## 2017-01-31 NOTE — Telephone Encounter (Signed)
Spoke to pt on the phone and [facility] [place] her and scheduled her at 3 today so she didn't need an appt with us. Pt was advised to call back if anything changes.
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
## 2017-02-14 NOTE — Progress Notes (Signed)
Psychiatric Initial Adult Assessment   Patient Identification: Stacey Gonzalez MRN:  [id number] Date of Evaluation:  02/20/2017 Referral Source: Bynum Bellows, LCSW Chief Complaint:   Chief Complaint    Depression; Psychiatric Evaluation     Visit Diagnosis:    ICD-10-CM   1. MDD (major depressive disorder), recurrent episode, moderate (HCC) F33.1     History of Present Illness:   Stacey Gonzalez is a 21 year old female with depression, who is referred for depression. She states that she is here as he is "drowning, sinking, and depressed." She states that she suffered similar episode twice in the past; once in spring in Sep 21, 2005, when she had SI constantly for two months. She also had another episode in 01/2016 when she was "unhappy" and not feeling herself around Christmas. She has been isolating, has no energy and feels depressed. Although she had avoided to see a provider after seeing "what they did to my grandfather (who suffered depression)," she decided to come for help this time. Although she cannot recall any trigger, she states that her grandfather deceased by suicide in 21-Sep-2016. She states that she knew it was coming two days before and shared it with other of his family member. She felt "relieved' that he does not need to suffer anymore as he had declined to have any professional help. It is "strange" to see her grandmother reacting more, from grieving to become "furious" against him. Although she had tried to check in with her grandmother, she stopped it as she does not appear to take it well. She reports good relationship with her otherwise, and reports great relationship with her twin brother.   She feels depressed. She has insomnia.She has decreased energy. Although she used to have some SI when coming to se  Mr. Sheets, she denies any this time. She feels anxious at times. She denies panic attacks. She denies decreased need for sleep or euphoria. She denies alcohol use or drug use.    Associated Signs/Symptoms: Depression Symptoms:  depressed mood, anhedonia, insomnia, recurrent thoughts of death, anxiety, (Hypo) Manic Symptoms:  denies Anxiety Symptoms:  mild anxiety Psychotic Symptoms:  denies PTSD Symptoms: Negative  Past Psychiatric History:  Outpatient: denies Psychiatry admission: denies Previous suicide attempt: denies Past trials of medication: denies History of violence: denies  Previous Psychotropic Medications: No   Substance Abuse History in the last 12 months:  No.  Consequences of Substance Abuse: NA  Past Medical History: No past medical history on file. No past surgical history on file.  Family Psychiatric History:  denies  Family History:  Family History  Problem Relation Age of Onset  . Cancer Mother        colorectal     Social History:   Social History   Social History  . Marital status: Single    Spouse name: N/A  . Number of children: N/A  . Years of education: N/A   Social History Main Topics  . Smoking status: Never Smoker  . Smokeless tobacco: Never Used  . Alcohol use No  . Drug use: No  . Sexual activity: Not Asked   Other Topics Concern  . None   Social History Narrative  . 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 She was raised by her grandparents as her mother could not take care of both of  her and her twin brother. She reports good relationship with her grandparents. She has fair relationship with her mother.   Allergies:   Allergies  Allergen Reactions  . Latex Rash  . Zithromax [Azithromycin] Rash    Metabolic Disorder Labs: No results found for: HGBA1C, MPG No results found for: PROLACTIN No results found for: CHOL, TRIG, HDL, CHOLHDL, VLDL, LDLCALC   Current Medications: Current Outpatient Prescriptions  Medication Sig  Dispense Refill  . JUNEL FE 1.5/30 1.5-30 MG-MCG tablet TAKE 1 TABLET BY MOUTH EVERY DAY 28 tablet 13  . sertraline (ZOLOFT) 50 MG tablet 25 mg daily for two weeks, then 50 mg daily 30 tablet 1   No current facility-administered medications for this visit.     Neurologic: Headache: No Seizure: No Paresthesias:No  Musculoskeletal: Strength & Muscle Tone: within normal limits Gait & Station: normal Patient leans: N/A  Psychiatric Specialty Exam: ROS  Blood pressure 126/79, pulse 96, height  (1.651 m), weight 255 lb 3.2 oz (115.8 kg).Body mass index is 42.47 kg/m.  General Appearance: Fairly Groomed  Eye Contact:  Good  Speech:  soft, slightly delayed  Volume:  Normal  Mood:  Depressed  Affect:  Appropriate, Congruent and Constricted  Thought Process:  Coherent and Goal Directed  Orientation:  Full (Time, Place, and Person)  Thought Content:  Logical Perceptions: denies AH/VH  Suicidal Thoughts:  No  Homicidal Thoughts:  No  Memory:  Immediate;   Good Recent;   Good Remote;   Good  Judgement:  Good  Insight:  Fair  Psychomotor Activity:  Decreased  Concentration:  Concentration: Good and Attention Span: Good  Recall:  Good  Fund of Knowledge:Good  Language: Good  Akathisia:  No  Handed:  Right  AIMS (if indicated):  N/A  Assets:  Communication Skills Desire for Improvement  ADL's:  Intact  Cognition: WNL  Sleep:  poor   Assessment Stacey Gonzalez is a 21 year old female with depression, who is referred for depression.   # MDD, moderate, recurrent without psychotic features Exam is notable for slightly delayed psychomotor activity with constricted affect, and patient endorses Neurovegetative symptoms in the setting of loss of her grandfather by suicide attempt in May 2018, although she denies it as a stressor. Will start sertraline with up titration to target depression. Explored and normalized her emotion facing the death of her grandfather. She will greatly  benefit from supportive therapy/CBT; she is encouraged to continue to see a therapist.  Plan 1. Start sertraline 25 mg daily for two weeks, then 50 mg daily 2. Return to clinic in one month for 30 mins - She will make follow up appointment with Mr. Pollyann Savoy  The patient demonstrates the following risk factors for suicide: Chronic risk factors for suicide include: psychiatric disorder of epression. Acute risk factors for suicide include: family or marital conflict and loss (financial, interpersonal, professional). Protective factors for this patient include: positive social support, coping skills and hope for the future. Considering these factors, the overall suicide risk at this point appears to be low. Patient is appropriate for outpatient follow up.   Treatment Plan Summary: Plan as above   Neysa Hotter, MD 10/3/20184:37 PM

## 2017-02-20 ENCOUNTER — Encounter (HOSPITAL_COMMUNITY): Payer: Self-pay | Admitting: Psychiatry

## 2017-02-20 ENCOUNTER — Ambulatory Visit (INDEPENDENT_AMBULATORY_CARE_PROVIDER_SITE_OTHER): Payer: 59 | Admitting: Psychiatry

## 2017-02-20 VITALS — BP 126/79 | HR 96 | Ht 65.0 in | Wt 255.2 lb

## 2017-02-20 DIAGNOSIS — Z634 Disappearance and death of family member: Secondary | ICD-10-CM

## 2017-02-20 DIAGNOSIS — F331 Major depressive disorder, recurrent, moderate: Secondary | ICD-10-CM

## 2017-02-20 DIAGNOSIS — G4700 Insomnia, unspecified: Secondary | ICD-10-CM

## 2017-02-20 DIAGNOSIS — Z79899 Other long term (current) drug therapy: Secondary | ICD-10-CM | POA: Diagnosis not present

## 2017-02-20 DIAGNOSIS — Z818 Family history of other mental and behavioral disorders: Secondary | ICD-10-CM

## 2017-02-20 MED ORDER — SERTRALINE HCL 50 MG PO TABS
ORAL_TABLET | ORAL | 1 refills | Status: DC
Start: 2017-02-20 — End: 2017-05-08

## 2017-02-20 NOTE — Patient Instructions (Signed)
1. Start sertraline 25 mg daily for two weeks, then 50 mg daily 2. Return to clinic in one month for 30 mins

## 2017-03-19 ENCOUNTER — Telehealth (HOSPITAL_COMMUNITY): Payer: Self-pay

## 2017-03-19 NOTE — Telephone Encounter (Signed)
Noted, thanks!

## 2017-03-19 NOTE — Telephone Encounter (Signed)
Medication management - Called patient back to inform she has a refill of her [place]oloft at [facility] [place] and patient agreed to call back to schedule a new appt. with Dr. Vanetta ShawlHisada within the next 30 days.

## 2017-03-19 NOTE — Telephone Encounter (Signed)
Medication management - Attempted to call patient back after she left a message questioning if she had refills and how to make a new appt.  Patient's mailbox full so will continue efforts to try to reach to schedule. Pt. has a refill at pharmacy.

## 2017-03-19 NOTE — Telephone Encounter (Signed)
noted 

## 2017-03-25 ENCOUNTER — Encounter (HOSPITAL_COMMUNITY): Payer: Self-pay

## 2017-03-25 ENCOUNTER — Emergency Department (HOSPITAL_COMMUNITY)
Admission: EM | Admit: 2017-03-25 | Discharge: 2017-03-25 | Disposition: A | Discharge status: Home / Self Care | Payer: 59 | Attending: Emergency Medicine | Admitting: Emergency Medicine

## 2017-03-25 DIAGNOSIS — R55 Syncope and collapse: Secondary | ICD-10-CM | POA: Diagnosis not present

## 2017-03-25 DIAGNOSIS — Z79899 Other long term (current) drug therapy: Secondary | ICD-10-CM | POA: Diagnosis not present

## 2017-03-25 DIAGNOSIS — R42 Dizziness and giddiness: Secondary | ICD-10-CM | POA: Insufficient documentation

## 2017-03-25 DIAGNOSIS — Z91040 Latex allergy status: Secondary | ICD-10-CM | POA: Insufficient documentation

## 2017-03-25 LAB — URINALYSIS, ROUTINE W REFLEX MICROSCOPIC
BILIRUBIN URINE: NEGATIVE
GLUCOSE, UA: NEGATIVE mg/dL
Hgb urine dipstick: NEGATIVE
KETONES UR: 5 mg/dL — AB
Leukocytes, UA: NEGATIVE
NITRITE: NEGATIVE
PH: 7 (ref 5.0–8.0)
Protein, ur: NEGATIVE mg/dL
Specific Gravity, Urine: 1.014 (ref 1.005–1.030)

## 2017-03-25 LAB — CBC
HEMATOCRIT: 39.5 % (ref 36.0–46.0)
Hemoglobin: 13.1 g/dL (ref 12.0–15.0)
MCH: 28.9 pg (ref 26.0–34.0)
MCHC: 33.2 g/dL (ref 30.0–36.0)
MCV: 87 fL (ref 78.0–100.0)
Platelets: 238 10*3/uL (ref 150–400)
RBC: 4.54 MIL/uL (ref 3.87–5.11)
RDW: 14.4 % (ref 11.5–15.5)
WBC: 13.7 10*3/uL — ABNORMAL HIGH (ref 4.0–10.5)

## 2017-03-25 LAB — CBG MONITORING, ED: Glucose-Capillary: 99 mg/dL (ref 65–99)

## 2017-03-25 LAB — BASIC METABOLIC PANEL
Anion gap: 9 (ref 5–15)
BUN: 13 mg/dL (ref 6–20)
CO2: 25 mmol/L (ref 22–32)
Calcium: 9.2 mg/dL (ref 8.9–10.3)
Chloride: 104 mmol/L (ref 101–111)
Creatinine, Ser: 0.81 mg/dL (ref 0.44–1.00)
GFR calc Af Amer: >60 mL/min (ref >60)
GLUCOSE: 96 mg/dL (ref 65–99)
POTASSIUM: 3.8 mmol/L (ref 3.5–5.1)
Sodium: 138 mmol/L (ref 135–145)

## 2017-03-25 LAB — HCG, QUANTITATIVE, PREGNANCY

## 2017-03-25 MED ORDER — SODIUM CHLORIDE 0.9 % IV BOLUS (SEPSIS)
1000.0000 mL | Freq: Once | INTRAVENOUS | Status: AC
  Administered 2017-03-25: 1000 mL via INTRAVENOUS

## 2017-03-25 MED ORDER — ONDANSETRON HCL 4 MG/2ML IJ SOLN
4.0000 mg | Freq: Once | INTRAMUSCULAR | Status: AC
  Administered 2017-03-25: 4 mg via INTRAVENOUS
  Filled 2017-03-25: qty 2

## 2017-03-25 NOTE — ED Provider Notes (Signed)
[facility]-EMERGENCY DEPT Provider Note   CSN: [id number] Arrival date & time: 03/25/17  1236     History   Chief Complaint Chief Complaint  Patient presents with  . Near Syncope    HPI Stacey Gonzalez is a 21 y.o. female.  HPI   Stacey Gonzalez is a 21 y.o. female, with a history of MDD, presenting to the ED with lightheadedness that began shortly prior to arrival.  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  She walked out of the room and told a colleague who recommended that she come to the ED to get checked out.  Patient currently endorses some lightheadedness and nausea.  States that she did eat breakfast this morning and was about to take her lunch break.  She denies taking supplements, new diets, alcohol use, or illicit drug use. Denies recent illness, vomiting/diarrhea, fever/chills, shortness of breath, chest pain, palpitations, abdominal pain, urinary complaints, vision changes, headache, numbness/weakness, or any other complaints.  Just finished her menstrual cycle.    History reviewed. No pertinent past medical history.  Patient Active Problem List   Diagnosis Date Noted  . MDD (major depressive disorder), recurrent episode, moderate (HCC) 02/20/2017  . Patellar dislocation 01/25/2015    History reviewed. No pertinent surgical history.  OB History    No data available       Home Medications    Prior to Admission medications   Medication Sig Start Date End Date Taking? Authorizing Provider  Chlorpheniramine Maleate (ALLERGY PO) Take 1 tablet daily as needed by mouth (allergies).   Yes [provider]  JUNEL FE 1.5/30 1.5-30 MG-MCG tablet TAKE 1 TABLET BY MOUTH EVERY DAY 10/05/12  Yes Daphine Deutscher, Mary-Margaret, FNP  sertraline (ZOLOFT) 50 MG tablet 25 mg daily for two weeks, then 50 mg daily Patient taking differently: Take 50 mg at bedtime by mouth.  02/20/17  Yes Neysa Hotter, MD     Family History Family History  Problem Relation Age of Onset  . Cancer Mother        colorectal     Social History Social History   Tobacco Use  . Smoking status: Never Smoker  . Smokeless tobacco: Never Used  Substance Use Topics  . Alcohol use: No  . Drug use: No     Allergies   Latex and Zithromax [azithromycin]   Review of Systems Review of Systems  Constitutional: Negative for chills and fever.  Eyes: Negative for visual disturbance.  Respiratory: Negative for shortness of breath.   Cardiovascular: Negative for chest pain.  Gastrointestinal: Positive for nausea. Negative for abdominal pain, diarrhea and vomiting.  Genitourinary: Negative for dysuria and hematuria.  Neurological: Positive for syncope (near syncope) and light-headedness. Negative for weakness, numbness and headaches.  All other systems reviewed and are negative.    Physical Exam Updated Vital Signs BP 138/85 (BP Location: Right Arm)   Pulse 91   Temp (!) 97.5 F (36.4 C) (Oral)   Resp 18   Ht 5\' 5"  (1.651 m)   Wt 116.1 kg (256 lb)   SpO2 100%   BMI 42.60 kg/m   Physical Exam  Constitutional: She is oriented to person, place, and time. She appears well-developed and well-nourished. No distress.  HENT:  Head: Normocephalic and atraumatic.  Right Ear: Tympanic membrane, external ear and ear canal normal.  Left Ear: Tympanic membrane, external ear and ear canal normal.  Mouth/Throat:  Oropharynx is clear and moist.  Eyes: Conjunctivae and EOM are normal. Pupils are equal, round, and reactive to light.  Neck: Normal range of motion. Neck supple.  Cardiovascular: Normal rate, regular rhythm, normal heart sounds and intact distal pulses.  Pulmonary/Chest: Effort normal and breath sounds normal. No respiratory distress.  Abdominal: Soft. There is no tenderness. There is no guarding.  Musculoskeletal: She exhibits no edema.  Normal motor function intact in all extremities and spine. No  midline spinal tenderness.   Lymphadenopathy:    She has no cervical adenopathy.  Neurological: She is alert and oriented to person, place, and time.  No sensory deficits.  No noted speech deficits. No aphasia. Patient handles oral secretions without difficulty. No noted swallowing defects.  Equal grip strength bilaterally. Strength 5/5 in the upper extremities. Strength 5/5 with flexion and extension of the hips, knees, and ankles bilaterally.  No upright ataxia.  Gait testing deferred until after IV fluids. Coordination intact including heel to shin and finger to nose.  Cranial nerves III-XII grossly intact.  No facial droop.   Skin: Skin is warm and dry. Capillary refill takes less than 2 seconds. She is not diaphoretic.  Psychiatric: She has a normal mood and affect. Her behavior is normal.  Nursing note and vitals reviewed.    ED Treatments / Results  Labs (all labs ordered are listed, but only abnormal results are displayed) Labs Reviewed  CBC - Abnormal; Notable for the following components:      Result Value   WBC 13.7 (*)    All other components within normal limits  BASIC METABOLIC PANEL  HCG, QUANTITATIVE, PREGNANCY  URINALYSIS, ROUTINE W REFLEX MICROSCOPIC  CBG MONITORING, ED    EKG  EKG Interpretation  Date/Time:  Monday March 25 2017 12:46:15 EST Ventricular Rate:  87 PR Interval:    QRS Duration: 81 QT Interval:  343 QTC Calculation: 413 R Axis:   16 Text Interpretation:  Sinus rhythm T wave inversion in III Confirmed by Frederick PeersLittle, Rachel [id number]) on 03/25/2017 1:57:38 PM       Radiology No results found.  Procedures Procedures (including critical care time)  Medications Ordered in ED Medications - No data to display   Initial Impression / Assessment and Plan / ED Course  I have reviewed the triage vital signs and the nursing notes.  Pertinent labs & imaging results that were available during my care of the patient were reviewed by me and  considered in my medical decision making (see chart for details).  Clinical Course as of Mar 26 1719  Sheral FlowMon Mar 25, 2017  1626 Patient rechecked.  This is my first time meeting the patient since shift change.  Liter bolus of fluids has just finished.  The patient reports her lightheadedness has improved.  Will fluid challenge her with Benadryl and saltine crackers and walker throughout the unit with plan to discharge her.  [MM]    Clinical Course User Index [MM] McDonald, Mia A, PA-C    End of shift patient care end of report given to Mia McDonald, PA-C. Plan: Finish IV fluids.  PO Challenge.  Reassess.  Ambulate.  Expect patient to discharge home.     Final Clinical Impressions(s) / ED Diagnoses   Final diagnoses:  None    ED Discharge Orders    None       Concepcion LivingJoy, Bernon Arviso C, PA-C 03/25/17 1722    Jacalyn LefevreHaviland, Julie, MD 03/25/17 1906

## 2017-03-25 NOTE — ED Triage Notes (Signed)
Pt. Brought over from cancer center via wheelchair. 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 When she felt light headed and dizzy. Pt. Told a nurse at the cancer center who recommended she come to the ED. Pt. States she still feel dizzy.

## 2017-03-25 NOTE — Discharge Instructions (Signed)
You can call Cardiology to schedule a follow up appointment from today's visit. You can also call the number on your discharge paperwork to get established with a new primary care provider if symptoms persist, but do not worsen.  Continue to hydrate well with plenty of fluids and eat small meals at several intervals throughout the day. As you know, sometimes in healthcare, it's very easy to go for hours and not realize that you have for gotten to eat or drink.  Your urinalysis is pending, if you download the app MyChart, you can view the results once it is available.  If you develop new or worsening symptoms, including passing out, severe vomiting, chest pain, or shortness of breath with activity, please return to the Emergency Department for re-evaluation.

## 2017-03-25 NOTE — ED Provider Notes (Signed)
21 year old female received a signout from [place] Joy pending completing IV fluid bolus, fluid challenge and ambulation on pulse ox. Per his history:  "HPI   Stacey Gonzalez is a 21 y.o. female, with a history of MDD, presenting to the ED with lightheadedness that began shortly prior to arrival.  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  She walked out of the room and told a colleague who recommended that she come to the ED to get checked out.  Patient currently endorses some lightheadedness and nausea.  States that she did eat breakfast this morning and was about to take her lunch break.  She denies taking supplements, new diets, alcohol use, or illicit drug use. Denies recent illness, vomiting/diarrhea, fever/chills, shortness of breath, chest pain, palpitations, abdominal pain, urinary complaints, vision changes, headache, numbness/weakness, or any other complaints.  Just finished her menstrual cycle."     Physical Exam  BP 118/67   Pulse 79   Temp (!) 97.5 F (36.4 C) (Oral)   Resp 18   Ht 5\' 5"  (1.651 m)   Wt 116.1 kg (256 lb)   SpO2 100%   BMI 42.60 kg/m   Physical Exam Patient sitting upright in bed.  No acute distress.  Speaking in clear goal oriented sentences.  ED Course  Procedures  MDM  Patient reports much improvement after IV fluid bolus.  Lengthy discussion with the patient and her mother regarding today's ED workup.  Mild leukocytosis of 13.7, which does not correlate with the patient's symptoms. No electrolyte abnormalities. No concern for dehydration. No hypoglycemia. EKG is unremarkable. Discussed with the patient and her mother that there can be many causes of lightheadedness. Discussed good PO intake throughout the day while she is working.  The patient also reports she began taking Zoloft approximately 1 month ago. Dizziness and fatigue are two of the most reports adverse reactions of this medication.  I  question increased vasovagal response vs medication side effects vs decreased PO intake as the etiology of her symptoms.  Doubt cardiac etiology or dehydration. discussed with the patient that she can follow-up with cardiology and her PCP regarding today's visit.  The patient and her mother are agreeable with the plan at this time.  Strict return precautions given.  No acute distress.  The patient is safe for discharge at this time.        Barkley BoardsMcDonald, Daine Croker A, PA-C 03/25/17 1801    Jacalyn LefevreHaviland, Julie, MD 03/25/17 Ebony [id number]

## 2017-04-08 DIAGNOSIS — Z01411 Encounter for gynecological examination (general) (routine) with abnormal findings: Secondary | ICD-10-CM | POA: Diagnosis not present

## 2017-04-08 DIAGNOSIS — Z3041 Encounter for surveillance of contraceptive pills: Secondary | ICD-10-CM | POA: Diagnosis not present

## 2017-04-08 DIAGNOSIS — L700 Acne vulgaris: Secondary | ICD-10-CM | POA: Diagnosis not present

## 2017-04-08 DIAGNOSIS — L739 Follicular disorder, unspecified: Secondary | ICD-10-CM | POA: Diagnosis not present

## 2017-05-03 NOTE — Progress Notes (Signed)
BH MD/PA/NP OP Progress Note  05/08/2017 9:50 AM Stacey Lorayne BenderM Chilton  MRN:  [id number]  Chief Complaint:  Chief Complaint    Depression; Follow-up     HPI:  Patient presents for follow-up appointment for depression.  She states that she has not been able to get out from work and has been running out of her medication 2 weeks ago.  She has been doing very well when she was on medication.  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 She likes the job itself and it allows her to come to the clinic more frequently.  She is going to school to get degree to apply for nursing school.  She reports good time on Thanksgiving.  She feels a little anxious about Christmas as it is the first once after her grandfather's deaths.  She talks about her grandmother, who takes stairs at her grandfather's chair on Thanksgiving.  She misses him, although she denies it is getting intense.  She has insomnia with nighttime awakening.  She has more energy and motivation.  She has better concentration.  She denies SI.  She occasionally feels anxious.  She denies panic attacks.   Visit Diagnosis:    ICD-10-CM   1. MDD (major depressive disorder), recurrent episode, moderate (HCC) F33.1     Past Psychiatric History:  I have reviewed the patient's psychiatry history in detail and updated the patient record. Outpatient: denies Psychiatry admission: denies Previous suicide attempt: denies Past trials of medication: denies History of violence: denies  Past Medical History: No past medical history on file. No past surgical history on file.  Family Psychiatric History:  I have reviewed the patient's family history in detail and updated the patient record.  Family History:  Family History  Problem Relation Age of Onset  . Cancer Mother        colorectal     Social History:  Social History   Socioeconomic History  . Marital status: Single    Spouse name: None  . Number of children: None  . Years of  education: None  . Highest education level: None  Social Needs  . Financial resource strain: None  . Food insecurity - worry: None  . Food insecurity - inability: None  . Transportation needs - medical: None  . Transportation needs - non-medical: None  Occupational History  . None  Tobacco Use  . Smoking status: Never Smoker  . Smokeless tobacco: Never Used  Substance and Sexual Activity  . Alcohol use: No  . Drug use: No  . Sexual activity: No  Other Topics Concern  . None  Social History Narrative  . None    Allergies:  Allergies  Allergen Reactions  . Latex Rash  . Zithromax [Azithromycin] Rash    Metabolic Disorder Labs: No results found for: HGBA1C, MPG No results found for: PROLACTIN No results found for: CHOL, TRIG, HDL, CHOLHDL, VLDL, LDLCALC Lab Results  Component Value Date   TSH 1.550 12/28/2015    Therapeutic Level Labs: No results found for: LITHIUM No results found for: VALPROATE No components found for:  CBMZ  Current Medications: Current Outpatient Medications  Medication Sig Dispense Refill  . Chlorpheniramine Maleate (ALLERGY PO) Take 1 tablet daily as needed by mouth (allergies).    Colleen Can. JUNEL FE 1.5/30 1.5-30 MG-MCG tablet TAKE 1 TABLET BY MOUTH EVERY DAY 28 tablet 13  . sertraline (ZOLOFT) 50 MG tablet Take 1 tablet (50 mg total) by mouth daily. 30 tablet 1  .  traZODone (DESYREL) 50 MG tablet 25-50 mg at night as needed for sleep 30 tablet 1   No current facility-administered medications for this visit.      Musculoskeletal: Strength & Muscle Tone: within normal limits Gait & Station: normal Patient leans: N/A  Psychiatric Specialty Exam: Review of Systems  Psychiatric/Behavioral: Positive for depression. Negative for hallucinations, memory loss, substance abuse and suicidal ideas. The patient has insomnia. The patient is not nervous/anxious.   All other systems reviewed and are negative.   Blood pressure 132/86, pulse 90, height 5'  5" (1.651 m), weight 268 lb (121.6 kg), SpO2 96 %.Body mass index is 44.6 kg/m.  General Appearance: Fairly Groomed  Eye Contact:  Good  Speech:  Clear and Coherent  Volume:  soft  Mood:  "better"  Affect:  less restricted- significantly improved  Thought Process:  Coherent and Goal Directed  Orientation:  Full (Time, Place, and Person)  Thought Content: Logical   Suicidal Thoughts:  No  Homicidal Thoughts:  No  Memory:  Immediate;   Good  Judgement:  Good  Insight:  Fair  Psychomotor Activity:  Normal- improving  Concentration:  Concentration: Good and Attention Span: Good  Recall:  Good  Fund of Knowledge: Good  Language: Good  Akathisia:  No  Handed:  Right  AIMS (if indicated): not done  Assets:  Communication Skills Desire for Improvement  ADL's:  Intact  Cognition: WNL  Sleep:  Poor   Screenings: PHQ2-9     Office Visit from 12/26/2015 in [facility] Office Visit from 12/13/2015 in [facility]  PHQ-2 Total Score  0  0      Assessment and Plan:  Stacey Brenneria M Sloop is a 21 y.o. year old female with a history of depression, who presents for follow up appointment for MDD (major depressive disorder), recurrent episode, moderate (HCC)  # MDD, moderate, recurrent without psychotic features There has been improvement in neurovegetative symptoms and psychomotor activity after starting sertraline.  She ran out of sertraline for the past 2 weeks, although she states that she felt almost back to herself when she was on the medication.  We will restart sertraline at the same dose to target depression.  Discussed behavioral activation.  She is encouraged to see a therapist when her schedule allows.   # Insomnia Discussed sleep hygiene.  Given she has shift change soon, she is advised to try melatonin for insomnia.  Will also prescribe trazodone as needed for insomnia.   Plan 1. Continue sertraline 50 mg daily  2. Start Melatonin 3 mg two  hours before going to bed (when you have change in your shift) 3. Start trazodone 25-50 mg at night as needed for sleep 4. Return to clinic in 1-2 months for 30 mins - She will make follow up appointment with Mr. Pollyann SavoySheets  The patient demonstrates the following risk factors for suicide: Chronic risk factors for suicide include: psychiatric disorder of depression. Acute risk factors for suicide include: family or marital conflict and loss (financial, interpersonal, professional). Protective factors for this patient include: positive social support, coping skills and hope for the future. Considering these factors, the overall suicide risk   The duration of this appointment visit was 30 minutes of face-to-face time with the patient.  Greater than 50% of this time was spent in counseling, explanation of  diagnosis, planning of further management, and coordination of care.  Neysa Hottereina Tasharra Nodine, MD 05/08/2017, 9:50 AM

## 2017-05-08 ENCOUNTER — Ambulatory Visit (HOSPITAL_COMMUNITY): Payer: 59 | Admitting: Psychiatry

## 2017-05-08 ENCOUNTER — Encounter (HOSPITAL_COMMUNITY): Payer: Self-pay | Admitting: Psychiatry

## 2017-05-08 VITALS — BP 132/86 | HR 90 | Ht 65.0 in | Wt 268.0 lb

## 2017-05-08 DIAGNOSIS — F331 Major depressive disorder, recurrent, moderate: Secondary | ICD-10-CM | POA: Diagnosis not present

## 2017-05-08 DIAGNOSIS — G4700 Insomnia, unspecified: Secondary | ICD-10-CM | POA: Diagnosis not present

## 2017-05-08 MED ORDER — TRAZODONE HCL 50 MG PO TABS: ORAL_TABLET | ORAL | 1 refills | Status: DC

## 2017-05-08 MED ORDER — SERTRALINE HCL 50 MG PO TABS: 50.0000 mg | ORAL_TABLET | Freq: Every day | ORAL | 1 refills | Status: DC

## 2017-05-08 NOTE — Patient Instructions (Signed)
1. Continue sertraline 50 mg daily  2. Start Melatonin 3 mg two hours before going to bed (when you have change in your shift) 3. Start trazodone 25-50 mg at night as needed for sleep 4. Return to clinic in 1-2 months for 30 mins

## 2017-05-18 DIAGNOSIS — J029 Acute pharyngitis, unspecified: Secondary | ICD-10-CM | POA: Diagnosis not present

## 2017-05-18 DIAGNOSIS — J00 Acute nasopharyngitis [common cold]: Secondary | ICD-10-CM | POA: Diagnosis not present

## 2017-06-20 ENCOUNTER — Encounter: Payer: Self-pay | Admitting: Physician Assistant

## 2017-06-20 ENCOUNTER — Ambulatory Visit: Payer: 59 | Admitting: Physician Assistant

## 2017-06-20 VITALS — BP 120/83 | HR 125 | Temp 98.0°F | Ht 65.0 in | Wt 270.0 lb

## 2017-06-20 DIAGNOSIS — R112 Nausea with vomiting, unspecified: Secondary | ICD-10-CM | POA: Diagnosis not present

## 2017-06-20 DIAGNOSIS — R291 Meningismus: Secondary | ICD-10-CM | POA: Diagnosis not present

## 2017-06-20 DIAGNOSIS — R509 Fever, unspecified: Secondary | ICD-10-CM | POA: Diagnosis not present

## 2017-06-20 LAB — CMP14+EGFR
A/G RATIO: 1.2 (ref 1.2–2.2)
ALT: 14 IU/L (ref 0–32)
AST: 15 IU/L (ref 0–40)
Albumin: 4.1 g/dL (ref 3.5–5.5)
Alkaline Phosphatase: 83 IU/L (ref 39–117)
BILIRUBIN TOTAL: 0.4 mg/dL (ref 0.0–1.2)
BUN / CREAT RATIO: 13 (ref 9–23)
BUN: 12 mg/dL (ref 6–20)
CALCIUM: 8.7 mg/dL (ref 8.7–10.2)
CHLORIDE: 102 mmol/L (ref 96–106)
CO2: 23 mmol/L (ref 20–29)
Creatinine, Ser: 0.93 mg/dL (ref 0.57–1.00)
GFR, EST AFRICAN AMERICAN: 100 mL/min/{1.73_m2} (ref >59)
GFR, EST NON AFRICAN AMERICAN: 87 mL/min/{1.73_m2} (ref >59)
Globulin, Total: 3.3 g/dL (ref 1.5–4.5)
Glucose: 100 mg/dL — ABNORMAL HIGH (ref 65–99)
POTASSIUM: 3.8 mmol/L (ref 3.5–5.2)
Sodium: 135 mmol/L (ref 134–144)
TOTAL PROTEIN: 7.4 g/dL (ref 6.0–8.5)

## 2017-06-20 LAB — CBC WITH DIFFERENTIAL/PLATELET
BASOS ABS: 0 10*3/uL (ref 0.0–0.2)
BASOS: 0 %
EOS (ABSOLUTE): 0 10*3/uL (ref 0.0–0.4)
Eos: 1 %
HEMATOCRIT: 41.3 % (ref 34.0–46.6)
Hemoglobin: 14.1 g/dL (ref 11.1–15.9)
Lymphocytes Absolute: 1.5 10*3/uL (ref 0.7–3.1)
Lymphs: 22 %
MCH: 28 pg (ref 26.6–33.0)
MCHC: 34.1 g/dL (ref 31.5–35.7)
MCV: 82 fL (ref 79–97)
MONOS ABS: 0.8 10*3/uL (ref 0.1–0.9)
Monocytes: 12 %
NEUTROS ABS: 4.3 10*3/uL (ref 1.4–7.0)
Neutrophils: 65 %
PLATELETS: 217 10*3/uL (ref 150–379)
RBC: 5.04 x10E6/uL (ref 3.77–5.28)
RDW: 14 % (ref 12.3–15.4)
WBC: 6.6 10*3/uL (ref 3.4–10.8)

## 2017-06-20 LAB — VERITOR FLU A/B WAIVED
Influenza A: NEGATIVE
Influenza B: NEGATIVE

## 2017-06-20 NOTE — Patient Instructions (Signed)
In a few days you may receive a survey in the mail or online from [employer] regarding your visit with us today. Please take a moment to fill this out. Your feedback is very important to our whole office. It can help us better understand your needs as well as improve your experience and satisfaction. Thank you for taking your time to complete it. We care about you.  Trooper Olander, PA-C  

## 2017-06-20 NOTE — Progress Notes (Signed)
BP 120/83   Pulse (!) 125   Temp 98 F (36.7 C) (Oral)   Ht 5' 5"  (1.651 m)   Wt 270 lb (122.5 kg)   BMI 44.93 kg/m    Subjective:    Patient ID: Stacey Gonzalez, female    DOB: May 23, 1995, 22 y.o.   MRN: [id number]  HPI: Stacey Gonzalez is a 22 y.o. female presenting on 06/20/2017 for Nausea; Fever; Diarrhea; and Headache  Patient started yesterday with fever chills and nausea.  It lasted throughout the day.  At this point she has a bad headache and it hurts to turn her neck.  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  She does not know of any direct flu exposure that she has had.  She is having some congestion and cough.  But the primary symptom is fever headache and neck pain.  She is up-to-date on meningococcal vaccine.  Relevant past medical, surgical, family and social history reviewed and updated as indicated. Allergies and medications reviewed and updated.  No past medical history on file.  No past surgical history on file.  Review of Systems  Constitutional: Positive for appetite change, chills, fatigue and fever. Negative for activity change.  HENT: Positive for congestion and postnasal drip. Negative for sore throat.   Eyes: Negative.   Respiratory: Negative for cough and wheezing.   Cardiovascular: Negative.  Negative for chest pain, palpitations and leg swelling.  Gastrointestinal: Negative.   Genitourinary: Negative.   Musculoskeletal: Positive for myalgias and neck stiffness.  Skin: Negative.   Neurological: Positive for headaches.    Allergies as of 06/20/2017      Reactions   Latex Rash   Zithromax [azithromycin] Rash      Medication List        Accurate as of 06/20/17 10:37 AM. Always use your most recent med list.          ALLERGY PO Take 1 tablet daily as needed by mouth (allergies).   JUNEL FE 1.5/30 1.5-30 MG-MCG tablet Generic drug:  norethindrone-ethinyl estradiol-iron TAKE 1 TABLET BY MOUTH EVERY DAY   sertraline 50 MG  tablet Commonly known as:  ZOLOFT Take 1 tablet (50 mg total) by mouth daily.   traZODone 50 MG tablet Commonly known as:  DESYREL 25-50 mg at night as needed for sleep          Objective:    BP 120/83   Pulse (!) 125   Temp 98 F (36.7 C) (Oral)   Ht 5' 5"  (1.651 m)   Wt 270 lb (122.5 kg)   BMI 44.93 kg/m   Allergies  Allergen Reactions  . Latex Rash  . Zithromax [Azithromycin] Rash    Physical Exam  Constitutional: She is oriented to person, place, and time. She appears well-developed and well-nourished. She appears distressed.  HENT:  Head: Normocephalic and atraumatic.  Eyes: Conjunctivae and EOM are normal. Pupils are equal, round, and reactive to light.  Neck: Trachea normal. Neck rigidity present. Kernig's sign noted. No Brudzinski's sign noted.  Cardiovascular: Normal rate, regular rhythm, normal heart sounds and intact distal pulses.  Pulmonary/Chest: Effort normal and breath sounds normal.  Abdominal: Soft. Bowel sounds are normal.  Neurological: She is alert and oriented to person, place, and time. She has normal strength and normal reflexes. No cranial nerve deficit. She displays a negative Romberg sign.  Positive nuchal rigidity on exam.  Negative Brudzenski's sign.   Skin: Skin is warm and dry.  No rash noted.  Psychiatric: She has a normal mood and affect. Her behavior is normal. Judgment and thought content normal.        Assessment & Plan:   1. Fever, unspecified fever cause - Veritor Flu A/B Waived - CBC with Differential/Platelet - CMP14+EGFR - Culture, blood (single) w Reflex to ID Panel  2. Nuchal rigidity - CBC with Differential/Platelet - CMP14+EGFR - Culture, blood (single) w Reflex to ID Panel  3. Non-intractable vomiting with nausea, unspecified vomiting type    Current Outpatient Medications:  .  Chlorpheniramine Maleate (ALLERGY PO), Take 1 tablet daily as needed by mouth (allergies)., Disp: , Rfl:  .  JUNEL FE 1.5/30 1.5-30  MG-MCG tablet, TAKE 1 TABLET BY MOUTH EVERY DAY, Disp: 28 tablet, Rfl: 13 .  sertraline (ZOLOFT) 50 MG tablet, Take 1 tablet (50 mg total) by mouth daily., Disp: 30 tablet, Rfl: 1 .  traZODone (DESYREL) 50 MG tablet, 25-50 mg at night as needed for sleep, Disp: 30 tablet, Rfl: 1 Continue all other maintenance medications as listed above.  Follow up plan: No Follow-up on file.  Educational handout given for [place] PA-C [place] [address]  [place], [place] [address] [phone]   06/20/2017, 10:37 AM

## 2017-06-21 LAB — CMP14+EGFR

## 2017-06-21 LAB — CBC WITH DIFFERENTIAL/PLATELET

## 2017-06-26 LAB — CULTURE, BLOOD (SINGLE)

## 2017-07-03 ENCOUNTER — Telehealth (HOSPITAL_COMMUNITY): Payer: Self-pay | Admitting: Psychiatry

## 2017-07-03 MED ORDER — SERTRALINE HCL 50 MG PO TABS: 50.0000 mg | ORAL_TABLET | Freq: Every day | ORAL | 0 refills | Status: DC

## 2017-07-03 MED ORDER — TRAZODONE HCL 50 MG PO TABS: ORAL_TABLET | ORAL | 0 refills | Status: DC

## 2017-07-03 NOTE — Telephone Encounter (Signed)
Received request for sertraline, trazodone refill. Ordered for a month. Please contact the patient to make follow up appointment.

## 2017-07-31 DIAGNOSIS — H52223 Regular astigmatism, bilateral: Secondary | ICD-10-CM | POA: Diagnosis not present

## 2017-07-31 DIAGNOSIS — H5213 Myopia, bilateral: Secondary | ICD-10-CM | POA: Diagnosis not present

## 2017-08-01 ENCOUNTER — Telehealth (HOSPITAL_COMMUNITY): Payer: Self-pay | Admitting: Psychiatry

## 2017-08-01 MED ORDER — SERTRALINE HCL 50 MG PO TABS
50.0000 mg | ORAL_TABLET | Freq: Every day | ORAL | 0 refills | Status: DC
Start: 2017-08-01 — End: 2017-09-05

## 2017-08-01 NOTE — Telephone Encounter (Signed)
Received refill request for sertraline. Ordered for another month. Please contact the patient to make follow up appointment. Also notify the patient that we will not do anymore refill without evaluation.

## 2017-08-05 ENCOUNTER — Telehealth (HOSPITAL_COMMUNITY): Payer: Self-pay

## 2017-08-05 NOTE — Telephone Encounter (Signed)
Late entry called patient on 08-01-17 @phone  number [phone] to schedule appointment per Dr. Vanetta ShawlHisada but was unable to leave a voicemail.  Called patient on 08-05-17 @[phone] informed patient of Dr. Vanetta ShawlHisada message to schedule a follow up appointment

## 2017-09-05 ENCOUNTER — Other Ambulatory Visit (HOSPITAL_COMMUNITY): Payer: Self-pay | Admitting: Psychiatry

## 2017-09-05 MED ORDER — SERTRALINE HCL 50 MG PO TABS: 50.0000 mg | ORAL_TABLET | Freq: Every day | ORAL | 0 refills | Status: DC

## 2017-09-14 DIAGNOSIS — K5900 Constipation, unspecified: Secondary | ICD-10-CM | POA: Diagnosis not present

## 2017-09-14 DIAGNOSIS — R1012 Left upper quadrant pain: Secondary | ICD-10-CM | POA: Diagnosis not present

## 2017-09-14 DIAGNOSIS — R109 Unspecified abdominal pain: Secondary | ICD-10-CM | POA: Diagnosis not present

## 2017-09-24 NOTE — Progress Notes (Signed)
BH MD/PA/NP OP Progress Note  09/25/2017 4:19 PM Stacey Gonzalez  MRN:  [id number]  Chief Complaint:  Chief Complaint    Anxiety; Follow-up; Depression     HPI:  Patient presents for follow-up appointment for depression.  She states that she has been very busy at work.  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 She states that she is treated almost like a "dog" from some nurse. Although she likes to help patients, she receives feedback which is counter to what she does. She feels hopeless about the situation. Her grandfather's anniversary is coming soon. She believes that she will be alright, although she is concerned about her grandmother. She likes to go to nursing school. She has not had time for herself, although she wants to take photography or doing volunteering for animals and children. She has fair sleep. She feels depressed more at times. She feels fatigue and has anhedonia. She denies SI. She feels anxious, tense. She had one panic attack. She denies irritability. She has fair concentration. She has fair appetite. She gained weight and has not done exercise.  Wt Readings from Last 3 Encounters:  09/25/17 272 lb (123.4 kg)  06/20/17 270 lb (122.5 kg)  05/08/17 268 lb (121.6 kg)    Visit Diagnosis:    ICD-10-CM   1. MDD (major depressive disorder), recurrent episode, moderate (HCC) F33.1     Past Psychiatric History:  I have reviewed the patient's psychiatry history in detail and updated the patient record. Outpatient:denies Psychiatry admission:denies Previous suicide attempt:denies Past trials of medication:denies History of violence:denies   Past Medical History: No past medical history on file. No past surgical history on file.  Family Psychiatric History:  I have reviewed the patient's family history in detail and updated the patient record.  Family History:  Family History  Problem Relation Age of Onset  . Cancer Mother    colorectal     Social History:  Social History   Socioeconomic History  . Marital status: Single    Spouse name: Not on file  . Number of children: Not on file  . Years of education: Not on file  . Highest education level: Not on file  Occupational History  . Not on file  Social Needs  . Financial resource strain: Not on file  . Food insecurity:    Worry: Not on file    Inability: Not on file  . Transportation needs:    Medical: Not on file    Non-medical: Not on file  Tobacco Use  . Smoking status: Never Smoker  . Smokeless tobacco: Never Used  Substance and Sexual Activity  . Alcohol use: No  . Drug use: No  . Sexual activity: Never  Lifestyle  . Physical activity:    Days per week: Not on file    Minutes per session: Not on file  . Stress: Not on file  Relationships  . Social connections:    Talks on phone: Not on file    Gets together: Not on file    Attends religious service: Not on file    Active member of club or organization: Not on file    Attends meetings of clubs or organizations: Not on file    Relationship status: Not on file  Other Topics Concern  . Not on file  Social History Narrative  . Not on file    Allergies:  Allergies  Allergen Reactions  . Latex Rash  .  Zithromax [Azithromycin] Rash    Metabolic Disorder Labs: No results found for: HGBA1C, MPG No results found for: PROLACTIN No results found for: CHOL, TRIG, HDL, CHOLHDL, VLDL, LDLCALC Lab Results  Component Value Date   TSH 1.550 12/28/2015    Therapeutic Level Labs: No results found for: LITHIUM No results found for: VALPROATE No components found for:  CBMZ 0 Current Medications: Current Outpatient Medications  Medication Sig Dispense Refill  . Chlorpheniramine Maleate (ALLERGY PO) Take 1 tablet daily as needed by mouth (allergies).    Colleen Can FE 1.5/30 1.5-30 MG-MCG tablet TAKE 1 TABLET BY MOUTH EVERY DAY 28 tablet 13  . sertraline (ZOLOFT) 100 MG tablet Take 1 tablet  (100 mg total) by mouth daily. 30 tablet 0  . traZODone (DESYREL) 50 MG tablet 25-50 mg at night as needed for sleep 30 tablet 0   No current facility-administered medications for this visit.      Musculoskeletal: Strength & Muscle Tone: within normal limits Gait & Station: normal Patient leans: N/A  Psychiatric Specialty Exam: Review of Systems  Psychiatric/Behavioral: Positive for depression. Negative for hallucinations, memory loss, substance abuse and suicidal ideas. The patient is nervous/anxious. The patient does not have insomnia.   All other systems reviewed and are negative.   Blood pressure 129/86, pulse (!) 110, height  (1.651 m), weight 272 lb (123.4 kg), SpO2 99 %.Body mass index is 45.26 kg/m.  General Appearance: Fairly Groomed  Eye Contact:  Good  Speech:  Clear and Coherent  Volume:  Normal  Mood:  Depressed  Affect:  Appropriate, Congruent, Restricted and down  Thought Process:  Coherent  Orientation:  Full (Time, Place, and Person)  Thought Content: Logical   Suicidal Thoughts:  No  Homicidal Thoughts:  No  Memory:  Immediate;   Good  Judgement:  Good  Insight:  Fair  Psychomotor Activity:  Normal  Concentration:  Concentration: Good and Attention Span: Good  Recall:  Good  Fund of Knowledge: Good  Language: Good  Akathisia:  No  Handed:  Right  AIMS (if indicated): not done  Assets:  Communication Skills Desire for Improvement  ADL's:  Intact  Cognition: WNL  Sleep:  Fair   Screenings: PHQ2-9     Office Visit from 06/20/2017 in [facility] Office Visit from 12/26/2015 in [facility] Office Visit from 12/13/2015 in [facility]  PHQ-2 Total Score  0  0  0       Assessment and Plan:  Stacey Gonzalez is a 22 y.o. year old female with a history of depression, who presents for follow up appointment for MDD (major depressive disorder), recurrent episode, moderate (HCC)  # MDD,  moderate, recurrent without psychotic features The patient continues to experience neurovegetative symptoms and anxiety since the last appointment.  Will uptitrate sertraline to target neurovegetative symptoms.  We will continue trazodone as needed for insomnia.  Discussed behavioral activation.  Explored her value of helping others and value congruent action she can commit. She is encouraged to see a therapist when her schedule allows.   Plan I have reviewed and updated plans as below 1 Increase sertraline 100 mg daily  2. Continue  Melatonin 3 mg two hours before going to bed (when you have change in your shift) 3. Continue trazodone 25-50 mg at night as needed for sleep 4. Return to clinic in a month for 30 mins - She will make follow up appointment with Mr. Pollyann Savoy - she  agrees to take a walk every day and taking photographs  The patient demonstrates the following risk factors for suicide: Chronic risk factors for suicide include:psychiatric disorder ofdepression. Acute risk factorsfor suicide include: family or marital conflict and loss (financial, interpersonal, professional). Protective factorsfor this patient include: positive social support, coping skills and hope for the future. Considering these factors, the overall suicide risk   The duration of this appointment visit was 30 minutes of face-to-face time with the patient.  Greater than 50% of this time was spent in counseling, explanation of  diagnosis, planning of further management, and coordination of care.   Neysa Hotter, MD 09/25/2017, 4:19 PM

## 2017-09-25 ENCOUNTER — Encounter (HOSPITAL_COMMUNITY): Payer: Self-pay | Admitting: Psychiatry

## 2017-09-25 ENCOUNTER — Ambulatory Visit (INDEPENDENT_AMBULATORY_CARE_PROVIDER_SITE_OTHER): Payer: 59 | Admitting: Psychiatry

## 2017-09-25 VITALS — BP 129/86 | HR 110 | Ht 65.0 in | Wt 272.0 lb

## 2017-09-25 DIAGNOSIS — R450 Nervousness: Secondary | ICD-10-CM | POA: Diagnosis not present

## 2017-09-25 DIAGNOSIS — Z564 Discord with boss and workmates: Secondary | ICD-10-CM

## 2017-09-25 DIAGNOSIS — F410 Panic disorder [episodic paroxysmal anxiety] without agoraphobia: Secondary | ICD-10-CM | POA: Diagnosis not present

## 2017-09-25 DIAGNOSIS — F331 Major depressive disorder, recurrent, moderate: Secondary | ICD-10-CM

## 2017-09-25 DIAGNOSIS — F419 Anxiety disorder, unspecified: Secondary | ICD-10-CM

## 2017-09-25 MED ORDER — SERTRALINE HCL 100 MG PO TABS: 100.0000 mg | ORAL_TABLET | Freq: Every day | ORAL | 0 refills | Status: DC

## 2017-09-25 NOTE — Patient Instructions (Signed)
1. Start sertraline 100 mg daily  2. Continue  Melatonin 3 mg two hours before going to bed  3. Continue trazodone 25-50 mg at night as needed for sleep 4. Return to clinic in a month for 30 mins

## 2017-10-06 ENCOUNTER — Other Ambulatory Visit (HOSPITAL_COMMUNITY): Payer: Self-pay | Admitting: Psychiatry

## 2017-10-23 ENCOUNTER — Telehealth (HOSPITAL_COMMUNITY): Payer: Self-pay | Admitting: Psychiatry

## 2017-10-23 MED ORDER — SERTRALINE HCL 100 MG PO TABS: 100.0000 mg | ORAL_TABLET | Freq: Every day | ORAL | 0 refills | Status: DC

## 2017-10-23 NOTE — Telephone Encounter (Signed)
Received sertraline refill request. Ordered for a month. Please advise the patient to make follow up appointment.

## 2017-10-23 NOTE — Telephone Encounter (Signed)
Called [facility] LVM per Provider: Received sertraline refill request. Ordered for a month. Please advise the patient to make follow up appointment.

## 2017-11-09 ENCOUNTER — Ambulatory Visit (INDEPENDENT_AMBULATORY_CARE_PROVIDER_SITE_OTHER): Payer: 59 | Admitting: Physician Assistant

## 2017-11-09 ENCOUNTER — Encounter: Payer: Self-pay | Admitting: Physician Assistant

## 2017-11-09 VITALS — BP 113/80 | HR 114 | Temp 98.3°F | Ht 65.0 in | Wt 270.0 lb

## 2017-11-09 DIAGNOSIS — L509 Urticaria, unspecified: Secondary | ICD-10-CM

## 2017-11-09 MED ORDER — FAMOTIDINE 20 MG PO TABS: 20.0000 mg | ORAL_TABLET | Freq: Two times a day (BID) | ORAL | 11 refills | Status: DC

## 2017-11-09 MED ORDER — LORATADINE 10 MG PO TABS: 10.0000 mg | ORAL_TABLET | Freq: Every day | ORAL | 11 refills | Status: DC

## 2017-11-09 MED ORDER — METHYLPREDNISOLONE ACETATE 80 MG/ML IJ SUSP
80.0000 mg | Freq: Once | INTRAMUSCULAR | Status: AC
  Administered 2017-11-09: 80 mg via INTRAMUSCULAR

## 2017-11-09 MED ORDER — CETIRIZINE HCL 10 MG PO TABS: 10.0000 mg | ORAL_TABLET | Freq: Every day | ORAL | 11 refills | Status: DC

## 2017-11-09 NOTE — Patient Instructions (Signed)
Angioedema °Angioedema is sudden swelling in the body. The swelling can happen in any part of the body. It often happens on the skin and causes itchy, bumpy patches (hives) to form. °This condition may: °· Happen only one time. °· Happen more than one time. It may come back at random times. °· Keep coming back for a number of years. Someday it may stop coming back. ° °Follow these instructions at home: °· Take over-the-counter and prescription medicines only as told by your doctor. °· If you were given medicines for emergency allergy treatment, always carry them with you. °· Wear a medical bracelet as told by your doctor. °· Avoid the things that cause your attacks (triggers). °· If this condition was passed to you from your parents and you want to have kids, talk to your doctor. Your kids may also have this condition. °Contact a doctor if: °· You have another attack. °· Your attacks happen more often, even after you take steps to prevent them. °· This condition was passed to you by your parents and you want to have kids. °Get help right away if: °· Your mouth, tongue, or lips get very swollen. °· You have trouble breathing. °· You have trouble swallowing. °· You pass out (faint). °This information is not intended to replace advice given to you by your health care provider. Make sure you discuss any questions you have with your health care provider. °Document Released: 04/25/2009 Document Revised: 12/07/2015 Document Reviewed: 11/15/2015 °Elsevier Interactive Patient Education © 2018 [employer] ° ° ° ° ° °

## 2017-11-11 DIAGNOSIS — L509 Urticaria, unspecified: Secondary | ICD-10-CM | POA: Insufficient documentation

## 2017-11-11 NOTE — Progress Notes (Signed)
BP 113/80   Pulse (!) 114   Temp 98.3 F (36.8 C) (Oral)   Ht 5' 5"  (1.651 m)   Wt 270 lb (122.5 kg)   BMI 44.93 kg/m    Subjective:    Patient ID: Stacey Gonzalez, female    DOB: 1995-09-09, 22 y.o.   MRN: [id number]  HPI: Stacey Gonzalez is a 22 y.o. female presenting on 11/09/2017 for rash all over (Itching. Started last night at work)  THe patient started red patches of itching that sound like urticaria or hives.  She does not know of any exposure to anything that would have bothered her.  She has had some issues with lavender in the past.  She does work in an emergency room. 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  She was given Benadryl and Pepcid last night by her provider at work.  She states that the hives have gone down but she still has significant amount of itching.  I have asked her to keep track of anything that seems to exacerbate it in the meantime we will be a little more aggressive in treating it.  No past medical history on file. Relevant past medical, surgical, family and social history reviewed and updated as indicated. Interim medical history since our last visit reviewed. Allergies and medications reviewed and updated. DATA REVIEWED: CHART IN EPIC  Family History reviewed for pertinent findings.  Review of Systems  Constitutional: Negative.  Negative for chills, fatigue and fever.  HENT: Negative.   Eyes: Negative.   Respiratory: Negative.   Cardiovascular: Negative.   Gastrointestinal: Negative.   Genitourinary: Negative.   Skin: Positive for color change and rash. Negative for pallor and wound.    Allergies as of 11/09/2017      Reactions   Latex Rash   Zithromax [azithromycin] Rash      Medication List        Accurate as of 11/09/17 11:59 PM. Always use your most recent med list.          ALLERGY PO Take 1 tablet daily as needed by mouth (allergies).   cetirizine 10 MG tablet Commonly known as:  ZYRTEC Take 1 tablet (10 mg total) by mouth  daily.   famotidine 20 MG tablet Commonly known as:  PEPCID Take 1 tablet (20 mg total) by mouth 2 (two) times daily.   JUNEL FE 1.5/30 1.5-30 MG-MCG tablet Generic drug:  norethindrone-ethinyl estradiol-iron TAKE 1 TABLET BY MOUTH EVERY DAY   loratadine 10 MG tablet Commonly known as:  CLARITIN Take 1 tablet (10 mg total) by mouth daily.   sertraline 100 MG tablet Commonly known as:  ZOLOFT Take 1 tablet (100 mg total) by mouth daily.   traZODone 50 MG tablet Commonly known as:  DESYREL 25-50 mg at night as needed for sleep          Objective:    BP 113/80   Pulse (!) 114   Temp 98.3 F (36.8 C) (Oral)   Ht 5' 5"  (1.651 m)   Wt 270 lb (122.5 kg)   BMI 44.93 kg/m   Allergies  Allergen Reactions  . Latex Rash  . Zithromax [Azithromycin] Rash    Wt Readings from Last 3 Encounters:  11/09/17 270 lb (122.5 kg)  06/20/17 270 lb (122.5 kg)  03/25/17 256 lb (116.1 kg)    Physical Exam  Constitutional: She is oriented to person, place, and time. She appears well-developed and well-nourished.  HENT:  Head: Normocephalic and  atraumatic.  Eyes: Pupils are equal, round, and reactive to light. Conjunctivae and EOM are normal.  Cardiovascular: Normal rate, regular rhythm, normal heart sounds and intact distal pulses.  Pulmonary/Chest: Effort normal and breath sounds normal.  Abdominal: Soft. Bowel sounds are normal.  Neurological: She is alert and oriented to person, place, and time. She has normal reflexes.  Skin: Skin is warm and dry. Lesion and rash noted. Rash is urticarial. There is erythema.  Lesions seen on extremities  Psychiatric: She has a normal mood and affect. Her behavior is normal. Judgment and thought content normal.    Results for orders placed or performed in visit on 06/20/17  Culture, blood (single) w Reflex to ID Panel  Result Value Ref Range   BLOOD CULTURE, ROUTINE Final report    Organism ID, Bacteria Comment   Veritor Flu A/B Waived    Result Value Ref Range   Influenza A Negative Negative   Influenza B Negative Negative  CBC with Differential/Platelet  Result Value Ref Range   WBC CANCELED x10E3/uL   RBC CANCELED    Hemoglobin CANCELED    Hematocrit CANCELED    Platelets CANCELED    Neutrophils CANCELED    Lymphs CANCELED    Monocytes CANCELED    Eos CANCELED    Lymphocytes Absolute CANCELED    EOS (ABSOLUTE) CANCELED    Basophils Absolute CANCELED   CMP14+EGFR  Result Value Ref Range   Glucose CANCELED mg/dL   BUN CANCELED    Creatinine, Ser CANCELED    Sodium CANCELED    Potassium CANCELED    Chloride CANCELED    CO2 CANCELED    Calcium CANCELED    Total Protein CANCELED    Albumin CANCELED    Bilirubin Total CANCELED    Alkaline Phosphatase CANCELED    AST CANCELED    ALT CANCELED   CMP14+EGFR  Result Value Ref Range   Glucose 100 (H) 65 - 99 mg/dL   BUN 12 6 - 20 mg/dL   Creatinine, Ser 0.93 0.57 - 1.00 mg/dL   GFR calc non Af Amer 87 >59 mL/min/1.73   GFR calc Af Amer 100 >59 mL/min/1.73   BUN/Creatinine Ratio 13 9 - 23   Sodium 135 134 - 144 mmol/L   Potassium 3.8 3.5 - 5.2 mmol/L   Chloride 102 96 - 106 mmol/L   CO2 23 20 - 29 mmol/L   Calcium 8.7 8.7 - 10.2 mg/dL   Total Protein 7.4 6.0 - 8.5 g/dL   Albumin 4.1 3.5 - 5.5 g/dL   Globulin, Total 3.3 1.5 - 4.5 g/dL   Albumin/Globulin Ratio 1.2 1.2 - 2.2   Bilirubin Total 0.4 0.0 - 1.2 mg/dL   Alkaline Phosphatase 83 39 - 117 IU/L   AST 15 0 - 40 IU/L   ALT 14 0 - 32 IU/L  CBC with Differential/Platelet  Result Value Ref Range   WBC 6.6 3.4 - 10.8 x10E3/uL   RBC 5.04 3.77 - 5.28 x10E6/uL   Hemoglobin 14.1 11.1 - 15.9 g/dL   Hematocrit 41.3 34.0 - 46.6 %   MCV 82 79 - 97 fL   MCH 28.0 26.6 - 33.0 pg   MCHC 34.1 31.5 - 35.7 g/dL   RDW 14.0 12.3 - 15.4 %   Platelets 217 150 - 379 x10E3/uL   Neutrophils 65 Not Estab. %   Lymphs 22 Not Estab. %   Monocytes 12 Not Estab. %   Eos 1 Not Estab. %   Basos 0  Not Estab. %    Neutrophils Absolute 4.3 1.4 - 7.0 x10E3/uL   Lymphocytes Absolute 1.5 0.7 - 3.1 x10E3/uL   Monocytes Absolute 0.8 0.1 - 0.9 x10E3/uL   EOS (ABSOLUTE) 0.0 0.0 - 0.4 x10E3/uL   Basophils Absolute 0.0 0.0 - 0.2 x10E3/uL      Assessment & Plan:   1. Urticaria - loratadine (CLARITIN) 10 MG tablet; Take 1 tablet (10 mg total) by mouth daily.  Dispense: 30 tablet; Refill: 11 - cetirizine (ZYRTEC) 10 MG tablet; Take 1 tablet (10 mg total) by mouth daily.  Dispense: 30 tablet; Refill: 11 - methylPREDNISolone acetate (DEPO-MEDROL) injection 80 mg - famotidine (PEPCID) 20 MG tablet; Take 1 tablet (20 mg total) by mouth 2 (two) times daily.  Dispense: 60 tablet; Refill: 11   Continue all other maintenance medications as listed above.  Follow up plan: Return if symptoms worsen or fail to improve.  Educational handout given for [place] PA-C [facility] [address]  [place], [place] [address] [phone]   11/11/2017, 9:11 AM

## 2017-11-29 ENCOUNTER — Encounter (HOSPITAL_COMMUNITY): Payer: Self-pay

## 2017-11-29 ENCOUNTER — Other Ambulatory Visit: Payer: Self-pay

## 2017-11-29 ENCOUNTER — Emergency Department (HOSPITAL_COMMUNITY): Payer: PRIVATE HEALTH INSURANCE

## 2017-11-29 ENCOUNTER — Emergency Department (HOSPITAL_COMMUNITY)
Admission: EM | Admit: 2017-11-29 | Discharge: 2017-11-29 | Disposition: A | Discharge status: Home / Self Care | Payer: PRIVATE HEALTH INSURANCE | Attending: Emergency Medicine | Admitting: Emergency Medicine

## 2017-11-29 DIAGNOSIS — Y9289 Other specified places as the place of occurrence of the external cause: Secondary | ICD-10-CM | POA: Diagnosis not present

## 2017-11-29 DIAGNOSIS — Y9389 Activity, other specified: Secondary | ICD-10-CM | POA: Insufficient documentation

## 2017-11-29 DIAGNOSIS — S83004A Unspecified dislocation of right patella, initial encounter: Secondary | ICD-10-CM | POA: Diagnosis not present

## 2017-11-29 DIAGNOSIS — Z91040 Latex allergy status: Secondary | ICD-10-CM | POA: Diagnosis not present

## 2017-11-29 DIAGNOSIS — S8991XA Unspecified injury of right lower leg, initial encounter: Secondary | ICD-10-CM | POA: Diagnosis present

## 2017-11-29 DIAGNOSIS — Y990 Civilian activity done for income or pay: Secondary | ICD-10-CM | POA: Insufficient documentation

## 2017-11-29 DIAGNOSIS — X509XXA Other and unspecified overexertion or strenuous movements or postures, initial encounter: Secondary | ICD-10-CM | POA: Diagnosis not present

## 2017-11-29 DIAGNOSIS — Z79899 Other long term (current) drug therapy: Secondary | ICD-10-CM | POA: Diagnosis not present

## 2017-11-29 DIAGNOSIS — S83005A Unspecified dislocation of left patella, initial encounter: Secondary | ICD-10-CM

## 2017-11-29 LAB — I-STAT BETA HCG BLOOD, ED (MC, WL, AP ONLY)

## 2017-11-29 MED ORDER — HYDROCODONE-ACETAMINOPHEN 5-325 MG PO TABS
2.0000 | ORAL_TABLET | Freq: Once | ORAL | Status: AC
  Administered 2017-11-29: 2 via ORAL
  Filled 2017-11-29: qty 2

## 2017-11-29 MED ORDER — HYDROMORPHONE HCL 1 MG/ML IJ SOLN: 0.5000 mg | Freq: Once | INTRAMUSCULAR | Status: DC

## 2017-11-29 MED ORDER — FENTANYL CITRATE (PF) 100 MCG/2ML IJ SOLN
100.0000 ug | Freq: Once | INTRAMUSCULAR | Status: AC
  Administered 2017-11-29: 100 ug via INTRAVENOUS
  Filled 2017-11-29: qty 2

## 2017-11-29 NOTE — ED Provider Notes (Signed)
[facility]-EMERGENCY DEPT Provider Note   CSN: [id number] Arrival date & time: 11/29/17  1638     History   Chief Complaint Chief Complaint  Patient presents with  . Fall  . Knee Pain    HPI Stacey Gonzalez Rew is a 22 y.o. female who presents for evaluation of right knee pain that began earlier today.  Patient reports she was at work and was bending down to pick up some linens from the floor.  She reports when she went down, her leg twisted and she had immediate pain followed by swelling.  She has not been able to ambulate or bear weight on the knee since then.  She states that she has had a history of knee problems before but does not recall any specific diagnosis.  Patient denies any numbness/weakness.  The history is provided by the patient.    History reviewed. No pertinent past medical history.  Patient Active Problem List   Diagnosis Date Noted  . Urticaria 11/11/2017  . MDD (major depressive disorder), recurrent episode, moderate (HCC) 02/20/2017  . Patellar dislocation 01/25/2015    History reviewed. No pertinent surgical history.   OB History   None      Home Medications    Prior to Admission medications   Medication Sig Start Date End Date Taking? Authorizing Provider  ibuprofen (ADVIL,MOTRIN) 800 MG tablet Take 800 mg by mouth every 8 (eight) hours as needed for pain. 07/02/17 07/02/18 Yes [provider]  JUNEL FE 1.5/30 1.5-30 MG-MCG tablet TAKE 1 TABLET BY MOUTH EVERY DAY 10/05/12  Yes Daphine DeutscherMartin, Mary-Margaret, FNP  sertraline (ZOLOFT) 50 MG tablet Take 50 mg by mouth daily.   Yes [provider]  traZODone (DESYREL) 50 MG tablet 25-50 mg at night as needed for sleep 07/03/17  Yes Hisada, Barbee Cougheina, MD  cetirizine (ZYRTEC) 10 MG tablet Take 1 tablet (10 mg total) by mouth daily. Patient not taking: Reported on 11/29/2017 11/09/17   Remus LofflerJones, Angel S, PA-C  loratadine (CLARITIN) 10 MG tablet Take 1 tablet (10 mg total) by mouth  daily. Patient not taking: Reported on 11/29/2017 11/09/17   Remus LofflerJones, Angel S, PA-C    Family History Family History  Problem Relation Age of Onset  . Cancer Mother        colorectal     Social History Social History   Tobacco Use  . Smoking status: Never Smoker  . Smokeless tobacco: Never Used  Substance Use Topics  . Alcohol use: No  . Drug use: No     Allergies   Kiwi extract; Lavender oil; Latex; and Zithromax [azithromycin]   Review of Systems Review of Systems  Musculoskeletal:       Right knee pain and swelling  Neurological: Negative for weakness and numbness.  All other systems reviewed and are negative.    Physical Exam Updated Vital Signs Pulse 98   Temp 98.5 F (36.9 C) (Oral)   Resp 18   LMP 11/29/2017 (Approximate)   SpO2 100%   Physical Exam  Constitutional: She appears well-developed and well-nourished.  HENT:  Head: Normocephalic and atraumatic.  Eyes: Conjunctivae and EOM are normal. Right eye exhibits no discharge. Left eye exhibits no discharge. No scleral icterus.  Cardiovascular:  Pulses:      Dorsalis pedis pulses are 2+ on the right side, and 2+ on the left side.  Pulmonary/Chest: Effort normal.  Musculoskeletal:  Tenderness palpation of the anterior aspect of right knee with overlying soft tissue swelling and  deformity.  No crepitus noted.  Limited range of motion secondary to pain.  Limited flexion/extension secondary to pain.  Limited evaluation given patient's pain but does not appear to have any positive anterior posterior drawer.  No tenderness palpation noted to the proximal tib-fib, distal tibia, ankle of right lower extremity.  No abnormalities of the left lower extremity.  Neurological: She is alert.  Sensation intact along major nerve distributions of BLE  Skin: Skin is warm and dry. Capillary refill takes less than 2 seconds.  Good distal cap refill. RLE is not dusky in appearance or cool to touch.  Psychiatric: She has a  normal mood and affect. Her speech is normal and behavior is normal.  Nursing note and vitals reviewed.    ED Treatments / Results  Labs (all labs ordered are listed, but only abnormal results are displayed) Labs Reviewed  I-STAT BETA HCG BLOOD, ED ([facility], [facility], [facility] ONLY)    EKG None  Radiology Dg Knee 2 Views Right  Result Date: 11/29/2017 CLINICAL DATA:  Post reduction dislocated patella. EXAM: RIGHT KNEE - 1-2 VIEW COMPARISON:  Earlier same day. FINDINGS: Patella appears normally positioned. Small joint effusion. No visible fracture. IMPRESSION: Patella is relocated.  Small effusion.  No visible fracture. Electronically Signed   By: Paulina Fusi M.D.   On: 11/29/2017 18:26   Dg Knee Complete 4 Views Right  Result Date: 11/29/2017 CLINICAL DATA:  Knee pain after fall EXAM: RIGHT KNEE - COMPLETE 4+ VIEW COMPARISON:  None. FINDINGS: Lateral patellar dislocation without apparent fracture. Trace fluid in the suprapatellar compartment. The femorotibial compartment is maintained. IMPRESSION: Lateral dislocation of the patella. Electronically Signed   By: Tollie Eth M.D.   On: 11/29/2017 17:53    Procedures Reduction of dislocation Date/Time: 11/29/2017 6:10 PM Performed by: Maxwell Caul, PA-C Authorized by: Maxwell Caul, PA-C  Consent: Verbal consent obtained. Risks and benefits: risks, benefits and alternatives were discussed Consent given by: patient Patient understanding: patient states understanding of the procedure being performed Patient consent: the patient's understanding of the procedure matches consent given Procedure consent: procedure consent matches procedure scheduled Relevant documents: relevant documents present and verified Test results: test results available and properly labeled Site marked: the operative site was marked Imaging studies: imaging studies available Required items: required blood products, implants, devices, and special equipment  available Time out: Immediately prior to procedure a "time out" was called to verify the correct patient, procedure, equipment, support staff and site/side marked as required. Preparation: Patient was prepped and draped in the usual sterile fashion. Local anesthesia used: no  Anesthesia: Local anesthesia used: no  Sedation: Patient sedated: no  Patient tolerance: Patient tolerated the procedure well with no immediate complications    (including critical care time)  Medications Ordered in ED Medications  HYDROcodone-acetaminophen (NORCO/VICODIN) 5-325 MG per tablet 2 tablet (has no administration in time range)  fentaNYL (SUBLIMAZE) injection 100 mcg (100 mcg Intravenous Given 11/29/17 1745)     Initial Impression / Assessment and Plan / ED Course  I have reviewed the triage vital signs and the nursing notes.  Pertinent labs & imaging results that were available during my care of the patient were reviewed by me and considered in my medical decision making (see chart for details).     22 year old female who presents for evaluation of right knee pain that began just prior to ED arrival. Patient is afebrile, non-toxic appearing, sitting comfortably on examination table. Vital signs reviewed and stable. Patient is  neurovascularly intact.  On exam, patient is tenderness palpation to the anterior aspect of the right knee with deformity and overlying soft tissue swelling.  I suspect patellar dislocation.  Also consider fracture versus ligament sprain.  Will plan for analgesics, x-ray.  X-ray reviewed.  There is evidence of a lateral patella dislocation without any evidence of fracture.  Additionally, there is some effusion.  Discussed results with patient and family.  Explained that x-rays will shows any bony abnormality but does not show shows any ligament or tendon injury.  Given dislocation, will plan for reduction here in the ED.  Reduction done as documented above.  Patient tolerated  procedure well.  Repeat x-ray shows normal position patella with successful reduction.  Discussed results with patient.  She reports improvement in pain after reduction.  Still having some minor pain.  Will give additional analgesics.  Immobilizer placed.  Patient with good distal sensation and cap refill.  Discussed with patient at home supportive therapies.  Will give follow-up information for outpatient orthopedic. Patient had ample opportunity for questions and discussion. All patient's questions were answered with full understanding. Strict return precautions discussed. Patient expresses understanding and agreement to plan.   Final Clinical Impressions(s) / ED Diagnoses   Final diagnoses:  Dislocation of left patella, initial encounter    ED Discharge Orders    None       Rosana Hoes 11/29/17 Carlis Stable    Arby Barrette, MD 12/04/17 [phone]

## 2017-11-29 NOTE — ED Notes (Signed)
Bed: WA01 Expected date:  Expected time:  Means of arrival:  Comments: 

## 2017-11-29 NOTE — Discharge Instructions (Signed)
You can take Tylenol or Ibuprofen as directed for pain. You can alternate Tylenol and Ibuprofen every 4 hours. If you take Tylenol at 1pm, then you can take Ibuprofen at 5pm. Then you can take Tylenol again at 9pm.   Wear the knee immobilizer for support and stabilization.  Use the crutches until he sees orthopedic doctor.  Keep the leg elevated and apply ice to help with swelling.  Follow-up with referred orthopedic doctor for further evaluation.  Return the emergency department for any worsening pain, numbness/weakness of your leg, discoloration like any other worsening or concerning symptoms.

## 2017-11-29 NOTE — ED Triage Notes (Signed)
Pt is alert and oriented x 4 and is verbally responsive. Pt reports that she at work [facility] [employer]  and was reaching for linen and heard her RT knee make a "pop" sound and caused her to  fall to the floor. Pt reports that she was unable to get off the floor, and requiring  Assistance. Area has some swelling area elevated and iced.

## 2017-12-02 ENCOUNTER — Telehealth: Payer: Self-pay | Admitting: Orthopedic Surgery

## 2017-12-02 ENCOUNTER — Telehealth (HOSPITAL_COMMUNITY): Payer: Self-pay | Admitting: Psychiatry

## 2017-12-02 MED ORDER — SERTRALINE HCL 100 MG PO TABS: 100.0000 mg | ORAL_TABLET | Freq: Every day | ORAL | 0 refills | Status: DC

## 2017-12-02 NOTE — Telephone Encounter (Signed)
Patient called to inquire about appointment [facility]related [facility] to patella. States has reported through [employer]. Discussed workers comp protocol, of which patient is aware. Appointment pending approval.

## 2017-12-02 NOTE — Telephone Encounter (Signed)
Called and left voicemail message for patient to call for a follow up visit

## 2017-12-02 NOTE — Telephone Encounter (Signed)
Received refill request for sertraline. Ordered for a month. Please contact the patient to make follow up. I cannot do any more refill without evaluation.

## 2017-12-04 NOTE — Telephone Encounter (Signed)
Patient called back today, said has been seen for the work-related injury through [employer], [employer]'s worker's comp provider. She is asking if she can see Dr Romeo AppleHarrison "without filing worker's comp". Relayed to patient that work-related injuries are to be followed through according to worker's comp protocol, as initially discussed when she had called 12/02/17. Relayed I will contact her back after re-verifying.* *I have verified and have called back to patient to relay that we can not schedule and file private insurance for a work-related injury. Relayed that she can speak with worker's comp to approve evaluation by our clinic. Aware that  appointment cannot be scheduled without approval from worker's comp.

## 2017-12-11 ENCOUNTER — Encounter: Payer: Self-pay | Admitting: Physician Assistant

## 2017-12-11 ENCOUNTER — Ambulatory Visit (INDEPENDENT_AMBULATORY_CARE_PROVIDER_SITE_OTHER): Payer: 59 | Admitting: Physician Assistant

## 2017-12-11 VITALS — BP 124/88 | HR 94 | Temp 100.0°F | Ht 65.0 in | Wt 270.2 lb

## 2017-12-11 DIAGNOSIS — S83104D Unspecified dislocation of right knee, subsequent encounter: Secondary | ICD-10-CM

## 2017-12-11 DIAGNOSIS — S83104A Unspecified dislocation of right knee, initial encounter: Secondary | ICD-10-CM | POA: Insufficient documentation

## 2017-12-11 NOTE — Patient Instructions (Signed)

## 2017-12-11 NOTE — Progress Notes (Signed)
BP 124/88   Pulse 94   Temp 100 F (37.8 C) (Oral)   Ht 5\' 5"  (1.651 m)   Wt 270 lb 3.2 oz (122.6 kg)   LMP 11/29/2017 (Approximate)   BMI 44.96 kg/m    Subjective:    Patient ID: Stacey Gonzalez, female    DOB: 06/15/1995, 22 y.o.   MRN: [id number]  HPI: Stacey Gonzalez is a 22 y.o. female presenting on 12/11/2017 for Knee Pain (right )  Two weeks ago patient dislocated right knee. She has done the same to her  Left knee in the past.  She went through the ER and was placed in knee immobilizer.  She reports that she did [place] for many days, even though it is very cumbersome.  She has moved over to a more flexible knee support.  She had to maneuver about 6 flights of stairs yesterday because of elevator was out in the building.  She states the knee got much worse again.  She is not able to do her full duty at work.  They are trying to keep her on light duty options.  We would like to get an Ortho referral as soon as possible.  History reviewed. No pertinent past medical history. Relevant past medical, surgical, family and social history reviewed and updated as indicated. Interim medical history since our last visit reviewed. Allergies and medications reviewed and updated. DATA REVIEWED: CHART IN EPIC  Family History reviewed for pertinent findings.  Review of Systems  Constitutional: Negative.  Negative for activity change, fatigue and fever.  HENT: Negative.   Eyes: Negative.   Respiratory: Negative.  Negative for cough.   Cardiovascular: Negative.  Negative for chest pain.  Gastrointestinal: Negative.  Negative for abdominal pain.  Endocrine: Negative.   Genitourinary: Negative.  Negative for dysuria.  Musculoskeletal: Positive for arthralgias, gait problem, joint swelling and myalgias.  Skin: Negative.     Allergies as of 12/11/2017      Reactions   Kiwi Extract Anaphylaxis   Lavender Oil Other (See Comments)   Hives (if far away), migraine nausea eye swelling (if close)   Latex Rash   Zithromax [azithromycin] Rash      Medication List        Accurate as of 12/11/17  8:21 AM. Always use your most recent med list.          cetirizine 10 MG tablet Commonly known as:  ZYRTEC Take 1 tablet (10 mg total) by mouth daily.   ibuprofen 800 MG tablet Commonly known as:  ADVIL,MOTRIN Take 800 mg by mouth every 8 (eight) hours as needed for pain.   JUNEL FE 1.5/30 1.5-30 MG-MCG tablet Generic drug:  norethindrone-ethinyl estradiol-iron TAKE 1 TABLET BY MOUTH EVERY DAY   loratadine 10 MG tablet Commonly known as:  CLARITIN Take 1 tablet (10 mg total) by mouth daily.   sertraline 100 MG tablet Commonly known as:  ZOLOFT Take 1 tablet (100 mg total) by mouth daily.   traZODone 50 MG tablet Commonly known as:  DESYREL 25-50 mg at night as needed for sleep          Objective:    BP 124/88   Pulse 94   Temp 100 F (37.8 C) (Oral)   Ht 5\' 5"  (1.651 m)   Wt 270 lb 3.2 oz (122.6 kg)   LMP 11/29/2017 (Approximate)   BMI 44.96 kg/m   Allergies  Allergen Reactions  . Kiwi Extract Anaphylaxis  . Lavender Oil Other (  See Comments)    Hives (if far away), migraine nausea eye swelling (if close)  . Latex Rash  . Zithromax [Azithromycin] Rash    Wt Readings from Last 3 Encounters:  12/11/17 270 lb 3.2 oz (122.6 kg)  11/09/17 270 lb (122.5 kg)  06/20/17 270 lb (122.5 kg)    Physical Exam  Constitutional: She is oriented to person, place, and time. She appears well-developed and well-nourished.  HENT:  Head: Normocephalic and atraumatic.  Eyes: Pupils are equal, round, and reactive to light. Conjunctivae and EOM are normal.  Cardiovascular: Normal rate, regular rhythm, normal heart sounds and intact distal pulses.  Pulmonary/Chest: Effort normal and breath sounds normal.  Abdominal: Soft. Bowel sounds are normal.  Musculoskeletal:       Right knee: She exhibits decreased range of motion and swelling. Tenderness found.  Neurological: She is  alert and oriented to person, place, and time. She has normal reflexes.  Skin: Skin is warm and dry. No rash noted.  Psychiatric: She has a normal mood and affect. Her behavior is normal. Judgment and thought content normal.        Assessment & Plan:   1. Dislocation of right knee, subsequent encounter - Ambulatory referral to Orthopedic Surgery   Continue all other maintenance medications as listed above.  Follow up plan: Return if symptoms worsen or fail to improve.  Educational handout given for knee exercises  Remus Loffler PA-C [facility] [address]  [place], [place] [address] [phone]   12/11/2017, 8:21 AM

## 2017-12-12 DIAGNOSIS — M25561 Pain in right knee: Secondary | ICD-10-CM | POA: Diagnosis not present

## 2017-12-12 DIAGNOSIS — S83014A Lateral dislocation of right patella, initial encounter: Secondary | ICD-10-CM | POA: Diagnosis not present

## 2017-12-16 ENCOUNTER — Other Ambulatory Visit: Payer: Self-pay

## 2017-12-16 ENCOUNTER — Encounter: Payer: Self-pay | Admitting: Physical Therapy

## 2017-12-16 ENCOUNTER — Ambulatory Visit: Payer: 59 | Attending: Physician Assistant | Admitting: Physical Therapy

## 2017-12-16 DIAGNOSIS — M25561 Pain in right knee: Secondary | ICD-10-CM | POA: Insufficient documentation

## 2017-12-16 DIAGNOSIS — M25661 Stiffness of right knee, not elsewhere classified: Secondary | ICD-10-CM | POA: Insufficient documentation

## 2017-12-16 NOTE — Therapy (Signed)
[facility] [address] [place], [place], [address] Phone: [phone]   Fax:  [phone]  Physical Therapy Evaluation  Patient Details  Name: Stacey Gonzalez MRN: [id number] Date of Birth: 05-07-1996 Referring Provider: Malon Kindle MD.   Encounter Date: 12/16/2017  PT End of Session - 12/16/17 1503    Visit Number  1    Number of Visits  12    Date for PT Re-Evaluation  01/13/18    PT Start Time  0233    PT Stop Time  0315    PT Time Calculation (min)  42 min       History reviewed. No pertinent past medical history.  History reviewed. No pertinent surgical history.  There were no vitals filed for this visit.   Subjective Assessment - 12/16/17 1505    Subjective  The patient was at work on 11/29/17 and was bending over to get something of a linen cart when her right knee dislocated.  She presented to the ED and was placed in an imobilizer.  Today she is in a flexible brace with a patellar orifice.  Her pain is around a 4 today but recently had to go up several flights of stairs and her right knee pain became very high.  Pain decreases at rest and increases with movement and increased up time and stairs as aforementioned.      Pertinent History  Previous left knee patellar dislocation.    How long can you stand comfortably?  15 minutes+.    How long can you walk comfortably?  Short community distances.    Diagnostic tests  X-ray.    Patient Stated Goals  Get out of pain and back to normal.    Currently in Pain?  Yes    Pain Score  4     Pain Location  Knee    Pain Orientation  Right    Pain Descriptors / Indicators  Aching    Pain Type  Acute pain    Pain Onset  1 to 4 weeks ago    Pain Frequency  Intermittent    Aggravating Factors   See above.    Pain Relieving Factors  See above.         [facility] PT Assessment - 12/16/17 0001      Assessment   Medical Diagnosis  Pain in right knee.    Referring Provider  Malon Kindle  MD.    Onset Date/Surgical Date  -- 11/29/17.      Precautions   Required Braces or Orthoses  -- Right knee brace with patellar orifice.      Restrictions   Weight Bearing Restrictions  No      Balance Screen   Has the patient fallen in the past 6 months  No    Has the patient had a decrease in activity level because of a fear of falling?   No    Is the patient reluctant to leave their home because of a fear of falling?   No      Home Environment   Living Environment  Private residence      Prior Function   Level of Independence  Independent      Posture/Postural Control   Posture/Postural Control  Postural limitations    Postural Limitations  -- Bilateral knee genu valgum.      ROM / Strength   AROM / PROM / Strength  AROM;Strength      AROM   Overall  AROM Comments  Full right knee extension and flexion limited to 80 degrees.      Strength   Overall Strength Comments  Right hip strength normal.  Right knee extension 4 to 4+/5 limited at leaset in part due to pain.      Palpation   Palpation comment  Tender to palpation in region of medial aspect of patient's right patella.                Objective measurements completed on examination: See above findings.      [facility] Adult PT Treatment/Exercise - 12/16/17 0001      Ambulation/Gait   Gait Comments  Essentially normal gait pattern with right knee brace donned.      Modalities   Modalities  Electrical Stimulation;Vasopneumatic      Electrical Stimulation   Electrical Stimulation Location  Right medial patella region.    Electrical Stimulation Action  Pre-mod.    Electrical Stimulation Parameters  80-150 Hz x 15 minutes.    Electrical Stimulation Goals  Pain                  PT Long Term Goals - 12/16/17 1606      PT LONG TERM GOAL #1   Title  Ind with HEP.    Time  4    Period  Weeks    Status  New      PT LONG TERM GOAL #2   Title  Active right knee flexion= 115 degrees.    Time  4     Period  Weeks    Status  New      PT LONG TERM GOAL #3   Title  Perform ADL's with pain not > 2-3/10.    Time  4    Period  Weeks    Status  New      PT LONG TERM GOAL #4   Title  Walk a community distance with pain not > 2-3/10.    Time  4    Period  Weeks    Status  New             Plan - 12/16/17 1531    Clinical Impression Statement  The patient dislocated her right patella on 11/29/17.  She has limited flexion currently and is tender to palpation over the medial aspect of her right patella.  She is wearing a knee brace with a patellar orifice for additional stability. Patient will benefit from skilled physical therapy intervention to address deficits.     History and Personal Factors relevant to plan of care:  Previous left knee dislocation.    Clinical Presentation  Stable    Clinical Decision Making  Low    Rehab Potential  Excellent    PT Frequency  3x / week    PT Duration  4 weeks    PT Treatment/Interventions  ADLs/Self Care Home Management;Cryotherapy;Electrical Stimulation;Therapeutic activities;Therapeutic exercise;Neuromuscular re-education;Patient/family education;Passive range of motion       Patient will benefit from skilled therapeutic intervention in order to improve the following deficits and impairments:  Decreased activity tolerance, Pain, Decreased range of motion  Visit Diagnosis: Acute pain of right knee - Plan: PT plan of care cert/re-cert  Stiffness of right knee, not elsewhere classified - Plan: PT plan of care cert/re-cert     Problem List Patient Active Problem List   Diagnosis Date Noted  . Dislocation of right knee 12/11/2017  . Urticaria 11/11/2017  . MDD (major depressive disorder),  recurrent episode, moderate (HCC) 02/20/2017  . Patellar dislocation 01/25/2015    Tresea Heine, [place] MPT 12/16/2017, 4:09 PM  [facility] [address] [place], [place], [address] Phone: [phone]    Fax:  [phone]  Name: JOANN JORGE MRN: [id number] Date of Birth: 02-01-1996

## 2017-12-17 NOTE — Progress Notes (Signed)
BH MD/PA/NP OP Progress Note  12/24/2017 1:33 PM Stacey Gonzalez  MRN:  [id number]  Chief Complaint:  Chief Complaint    Depression; Follow-up     HPI:  Patient presents from follow-up appointment for depression.  She states that she dislocated her right knee.  She feels that other staffs are passive aggressive as she is unable to do work as she used to. There was a time she had SI without plan on the way back from work; she felt useless, guilty. She contacted her grandmother, who was supportive to her. Although she is still hoping to be transferred to ED work, she tries to stay positive regardless. She believes that doing journaling helps her.  She also talks about her twin brother, who has started to go to school to be a Systems analyst.  She meets with him on weekends; he is hoping to help the patient to be more active. She talks about her grandmother's brother, who has terminal cancer. She is concerned about her grandmother. She denies insomnia. She feels irritable at times. She has fair concentration. She denies current SI. She denies anxiety or panic attacks.   Wt Readings from Last 3 Encounters:  12/24/17 270 lb (122.5 kg)  12/11/17 270 lb 3.2 oz (122.6 kg)  11/09/17 270 lb (122.5 kg)   Visit Diagnosis:    ICD-10-CM   1. MDD (major depressive disorder), recurrent episode, moderate (HCC) F33.1     Past Psychiatric History: Please see initial evaluation for full details. I have reviewed the history. No updates at this time.     Past Medical History: No past medical history on file. No past surgical history on file.  Family Psychiatric History: Please see initial evaluation for full details. I have reviewed the history. No updates at this time.     Family History:  Family History  Problem Relation Age of Onset  . Cancer Mother        colorectal     Social History:  Social History   Socioeconomic History  . Marital status: Single    Spouse name: Not on file  . Number of  children: Not on file  . Years of education: Not on file  . Highest education level: Not on file  Occupational History  . Not on file  Social Needs  . Financial resource strain: Not on file  . Food insecurity:    Worry: Not on file    Inability: Not on file  . Transportation needs:    Medical: Not on file    Non-medical: Not on file  Tobacco Use  . Smoking status: Never Smoker  . Smokeless tobacco: Never Used  Substance and Sexual Activity  . Alcohol use: No  . Drug use: No  . Sexual activity: Never  Lifestyle  . Physical activity:    Days per week: Not on file    Minutes per session: Not on file  . Stress: Not on file  Relationships  . Social connections:    Talks on phone: Not on file    Gets together: Not on file    Attends religious service: Not on file    Active member of club or organization: Not on file    Attends meetings of clubs or organizations: Not on file    Relationship status: Not on file  Other Topics Concern  . Not on file  Social History Narrative  . Not on file    Allergies:  Allergies  Allergen Reactions  .  Kiwi Extract Anaphylaxis  . Lavender Oil Other (See Comments)    Hives (if far away), migraine nausea eye swelling (if close)  . Latex Rash  . Zithromax [Azithromycin] Rash    Metabolic Disorder Labs: No results found for: HGBA1C, MPG No results found for: PROLACTIN No results found for: CHOL, TRIG, HDL, CHOLHDL, VLDL, LDLCALC Lab Results  Component Value Date   TSH 1.550 12/28/2015    Therapeutic Level Labs: No results found for: LITHIUM No results found for: VALPROATE No components found for:  CBMZ  Current Medications: Current Outpatient Medications  Medication Sig Dispense Refill  . cetirizine (ZYRTEC) 10 MG tablet Take 1 tablet (10 mg total) by mouth daily. 30 tablet 11  . ibuprofen (ADVIL,MOTRIN) 800 MG tablet Take 800 mg by mouth every 8 (eight) hours as needed for pain.    Colleen Can FE 1.5/30 1.5-30 MG-MCG tablet TAKE 1  TABLET BY MOUTH EVERY DAY 28 tablet 13  . loratadine (CLARITIN) 10 MG tablet Take 1 tablet (10 mg total) by mouth daily. 30 tablet 11  . sertraline (ZOLOFT) 100 MG tablet Take 1 tablet (100 mg total) by mouth daily. 30 tablet 2  . traZODone (DESYREL) 50 MG tablet 25-50 mg at night as needed for sleep 30 tablet 0   No current facility-administered medications for this visit.      Musculoskeletal: Strength & Muscle Tone: within normal limits Gait & Station: normal Patient leans: N/A  Psychiatric Specialty Exam: Review of Systems  Psychiatric/Behavioral: Positive for depression. Negative for hallucinations, memory loss, substance abuse and suicidal ideas. The patient is not nervous/anxious and does not have insomnia.   All other systems reviewed and are negative.   Blood pressure 130/89, pulse 81, height 5\' 5"  (1.651 m), weight 270 lb (122.5 kg), last menstrual period 11/29/2017, SpO2 98 %.Body mass index is 44.93 kg/m.  General Appearance: Fairly Groomed  Eye Contact:  Good  Speech:  Clear and Coherent  Volume:  Normal  Mood:  Depressed  Affect:  Appropriate, Congruent and down, but improving  Thought Process:  Coherent  Orientation:  Full (Time, Place, and Person)  Thought Content: Logical   Suicidal Thoughts:  No  Homicidal Thoughts:  No  Memory:  Immediate;   Good  Judgement:  Good  Insight:  Fair  Psychomotor Activity:  Normal  Concentration:  Concentration: Good and Attention Span: Good  Recall:  Good  Fund of Knowledge: Good  Language: Good  Akathisia:  No  Handed:  Right  AIMS (if indicated): not done  Assets:  Communication Skills Desire for Improvement  ADL's:  Intact  Cognition: WNL  Sleep:  Fair   Screenings: PHQ2-9     Office Visit from 12/11/2017 in [facility] Office Visit from 11/09/2017 in [facility] Visit from 06/20/2017 in [facility] Visit from 12/26/2015 in [facility] Visit from 12/13/2015 in [facility]  PHQ-2 Total Score  1  0  0  0  0       Assessment and Plan:  [name] is a 22 y.o. year old female with a history of depression, who presents for follow up appointment for MDD (major depressive disorder), recurrent episode, moderate (HCC)  # MDD, moderate, recurrent without psychotic features Although the patient continues to endorse neurovegetative symptoms, it has been gradually improving after up titration of sertraline. Psychosocial stressors including work, recent knee dislocation. She lost her grandfather from suicide  in 2018. Although she may benefit from father up titration of sertraline, she prefers to stay on current dose.  Will continue sertraline to target depression.  Will continue trazodone as needed for insomnia.  Discussed behavioral activation. Explored her value of connection with others. Although she will benefit from continued therapy, she is unable to do it due to her work schedule.  Plan I have reviewed and updated plans as below 1. Continue sertraline 100 mg daily  2. Continue  Melatonin 3 mg two hours before going to bed  3. Continue trazodone 25-50 mg at night as needed for sleep (she declined refill) 4.Return to clinic in three months for 15 mins  The patient demonstrates the following risk factors for suicide: Chronic risk factors for suicide include:psychiatric disorder ofdepression. Acute risk factorsfor suicide include: family or marital conflict and loss (financial, interpersonal, professional). Protective factorsfor this patient include: positive social support, coping skills and hope for the future. Considering these factors, the overall suicide risk  The duration of this appointment visit was 30 minutes of face-to-face time with the patient.  Greater than 50% of this time was spent in counseling, explanation of  diagnosis, planning of further management,  and coordination of care.  Neysa Hottereina Yahayra Geis, MD 12/24/2017, 1:33 PM

## 2017-12-18 ENCOUNTER — Encounter: Payer: Self-pay | Admitting: Physical Therapy

## 2017-12-18 ENCOUNTER — Ambulatory Visit: Payer: 59 | Admitting: Physical Therapy

## 2017-12-18 DIAGNOSIS — M25661 Stiffness of right knee, not elsewhere classified: Secondary | ICD-10-CM | POA: Diagnosis not present

## 2017-12-18 DIAGNOSIS — M25561 Pain in right knee: Secondary | ICD-10-CM | POA: Diagnosis not present

## 2017-12-18 NOTE — Therapy (Signed)
[facility] [address] [place], [place], [address] Phone: [phone]   Fax:  [phone]  Physical Therapy Treatment  Patient Details  Name: Stacey Gonzalez MRN: [id number] Date of Birth: 06/11/1995 Referring Provider: Malon KindleSteven Norris MD.   Encounter Date: 12/18/2017  PT End of Session - 12/18/17 1457    Visit Number  2    Number of Visits  12    Date for PT Re-Evaluation  01/13/18    PT Start Time  0150       History reviewed. No pertinent past medical history.  History reviewed. No pertinent surgical history.  There were no vitals filed for this visit.  Subjective Assessment - 12/18/17 1502    Subjective  No new complaints.    Currently in Pain?  Yes    Pain Score  4     Pain Location  Knee    Pain Orientation  Right    Pain Descriptors / Indicators  Aching    Pain Type  Acute pain    Pain Onset  1 to 4 weeks ago                       [facility] Treatment/Exercise - 12/18/17 0001      Exercises   Exercises  Knee/Hip      Knee/Hip Exercises: Aerobic   Nustep  Level 3 x 15 minutes moving forward x 2 to increase knee flexion.      Knee/Hip Exercises: Supine   Short Arc Quad Sets Limitations  3 minutes with 3#.      Modalities   Modalities  Electrical Stimulation;Vasopneumatic      Electrical Stimulation   Electrical Stimulation Location  Right medial knee.    Electrical Stimulation Action  Pre-mod.    Electrical Stimulation Parameters  80-150 Hz x 20 minutes.      Vasopneumatic   Number Minutes Vasopneumatic   20 minutes    Vasopnuematic Location   -- Right knee.    Vasopneumatic Pressure  Medium      Manual Therapy   Manual Therapy  Passive ROM    Passive ROM  In supine:  Gentle low load stretching to increase right knee flexion x 5 minutes.                  PT Long Term Goals - 12/16/17 1606      PT LONG TERM GOAL #1   Title  Ind with HEP.    Time  4    Period  Weeks    Status  New      PT LONG TERM GOAL #2   Title  Active right knee flexion= 115 degrees.    Time  4    Period  Weeks    Status  New      PT LONG TERM GOAL #3   Title  Perform ADL's with pain not > 2-3/10.    Time  4    Period  Weeks    Status  New      PT LONG TERM GOAL #4   Title  Walk a community distance with pain not > 2-3/10.    Time  4    Period  Weeks    Status  New            Plan - 12/18/17 1509    Clinical Impression Statement  Patient did very well with treatment today.  She gained right knee flexion  without increased pain.       Patient will benefit from skilled therapeutic intervention in order to improve the following deficits and impairments:     Visit Diagnosis: Acute pain of right knee  Stiffness of right knee, not elsewhere classified     Problem List Patient Active Problem List   Diagnosis Date Noted  . Dislocation of right knee 12/11/2017  . Urticaria 11/11/2017  . MDD (major depressive disorder), recurrent episode, moderate (HCC) 02/20/2017  . Patellar dislocation 01/25/2015    APP[place], [place] MPT 12/18/2017, 3:11 PM  [facility] [address] [place], [place], [address] Phone: [phone]   Fax:  [phone]  Name: Stacey Gonzalez MRN: [id number] Date of Birth: 1995-11-01

## 2017-12-23 ENCOUNTER — Encounter: Payer: Self-pay | Admitting: Physical Therapy

## 2017-12-24 ENCOUNTER — Encounter: Payer: Self-pay | Admitting: Physical Therapy

## 2017-12-24 ENCOUNTER — Ambulatory Visit (HOSPITAL_COMMUNITY): Payer: 59 | Admitting: Psychiatry

## 2017-12-24 ENCOUNTER — Ambulatory Visit: Payer: 59 | Attending: Physician Assistant | Admitting: Physical Therapy

## 2017-12-24 ENCOUNTER — Encounter (HOSPITAL_COMMUNITY): Payer: Self-pay | Admitting: Psychiatry

## 2017-12-24 VITALS — BP 130/89 | HR 81 | Ht 65.0 in | Wt 270.0 lb

## 2017-12-24 DIAGNOSIS — M25561 Pain in right knee: Secondary | ICD-10-CM | POA: Diagnosis not present

## 2017-12-24 DIAGNOSIS — F331 Major depressive disorder, recurrent, moderate: Secondary | ICD-10-CM | POA: Diagnosis not present

## 2017-12-24 DIAGNOSIS — M25661 Stiffness of right knee, not elsewhere classified: Secondary | ICD-10-CM | POA: Insufficient documentation

## 2017-12-24 MED ORDER — SERTRALINE HCL 100 MG PO TABS: 100.0000 mg | ORAL_TABLET | Freq: Every day | ORAL | 2 refills | Status: DC

## 2017-12-24 NOTE — Patient Instructions (Signed)
1. Continue sertraline 100 mg daily  2. Continue  Melatonin 3 mg two hours before going to bed  3. Continue trazodone 25-50 mg at night as needed for sleep 4.Return to clinic in three months for 15 mins  CONTACT INFORMATION  What to do if you need to get in touch with someone regarding a psychiatric issue:  1. EMERGENCY: For psychiatric emergencies (if you are suicidal or if there are any other safety issues) call 911 and/or go to your nearest Emergency Room immediately.   2. IF YOU NEED SOMEONE TO TALK TO RIGHT NOW: Given my clinical responsibilities, I may not be able to speak with you over the phone for a prolonged period of time.  a. You may always call The National Suicide Prevention Lifeline at 1-800-273-TALK 8586457965(8255).  b. Your county of residence will also have local crisis services. [place]: [facility] at 360-093-6491952-886-0284 (24 Hour Crisis Hotline)

## 2017-12-24 NOTE — Therapy (Signed)
[facility] [address] [place], [place], [address] Phone: [phone]   Fax:  [phone]  Physical Therapy Treatment  Patient Details  Name: Stacey Gonzalez MRN: [id number] Date of Birth: 11/13/1995 Referring Provider: Malon KindleSteven Norris MD.   Encounter Date: 12/24/2017  PT End of Session - 12/24/17 1519    Visit Number  3    Number of Visits  12    Date for PT Re-Evaluation  01/13/18    PT Start Time  1515    PT Stop Time  1608    PT Time Calculation (min)  53 min    Activity Tolerance  Patient tolerated treatment well    Behavior During Therapy  [facility] for tasks assessed/performed       History reviewed. No pertinent past medical history.  History reviewed. No pertinent surgical history.  There were no vitals filed for this visit.  Subjective Assessment - 12/24/17 1520    Subjective  Patient reported pain is no longer high and nauseating but was a little sore after last treatment.     Pertinent History  Previous left knee patellar dislocation.    How long can you stand comfortably?  15 minutes+.    How long can you walk comfortably?  Short community distances.    Diagnostic tests  X-ray.    Patient Stated Goals  Get out of pain and back to normal.    Currently in Pain?  Yes    Pain Score  5     Pain Location  Knee    Pain Orientation  Right    Pain Descriptors / Indicators  Aching;Dull    Pain Type  Acute pain    Pain Onset  1 to 4 weeks ago         [facility] - 12/24/17 0001      Assessment   Medical Diagnosis  Pain in right knee.      Restrictions   Weight Bearing Restrictions  No                   [facility] Adult PT Treatment/Exercise - 12/24/17 0001      Exercises   Exercises  Knee/Hip      Knee/Hip Exercises: Aerobic   Nustep  Level 3 x 15 minutes moving forward x 2 to increase knee flexion.      Knee/Hip Exercises: Supine   Quad Sets  Strengthening;Right;Limitations 5 second hold x3 minutes     Short Arc Quad Sets Limitations  3 minutes 3# weight    Other Supine Knee/Hip Exercises  hamstring isometric digs 5" hold x3 mins      Modalities   Modalities  Electrical Stimulation;Vasopneumatic      Electrical Stimulation   Electrical Stimulation Location  Right medial knee.    Electrical Stimulation Action  pre-mod    Electrical Stimulation Parameters  80-150 hz x15 min    Electrical Stimulation Goals  Pain      Vasopneumatic   Number Minutes Vasopneumatic   15 minutes    Vasopnuematic Location   Knee    Vasopneumatic Pressure  Medium    Vasopneumatic Temperature   34      Manual Therapy   Manual Therapy  Passive ROM    Passive ROM  Gentle PROM into flexion with low loads to improve ROM                  PT Long Term Goals - 12/16/17 1606  PT LONG TERM GOAL #1   Title  Ind with HEP.    Time  4    Period  Weeks    Status  New      PT LONG TERM GOAL #2   Title  Active right knee flexion= 115 degrees.    Time  4    Period  Weeks    Status  New      PT LONG TERM GOAL #3   Title  Perform ADL's with pain not > 2-3/10.    Time  4    Period  Weeks    Status  New      PT LONG TERM GOAL #4   Title  Walk a community distance with pain not > 2-3/10.    Time  4    Period  Weeks    Status  New            Plan - 12/24/17 1751    Clinical Impression Statement  Patient was able to tolerate treatment well with minimal reports of pain, just achey. Patient demonstrated improved right knee flexion after PROM. Patient and PT discussed importance of quad strengthening to improve stability of patella. Patient reported understanding. No adverse affects noted upon removal of modalities.     Clinical Presentation  Stable    Clinical Decision Making  Low    Rehab Potential  Excellent    PT Frequency  3x / week    PT Duration  4 weeks    PT Treatment/Interventions  ADLs/Self Care Home Management;Cryotherapy;Electrical Stimulation;Therapeutic  activities;Therapeutic exercise;Neuromuscular re-education;Patient/family education;Passive range of motion    Consulted and Agree with Plan of Care  Patient       Patient will benefit from skilled therapeutic intervention in order to improve the following deficits and impairments:  Decreased activity tolerance, Pain, Decreased range of motion  Visit Diagnosis: Acute pain of right knee  Stiffness of right knee, not elsewhere classified     Problem List Patient Active Problem List   Diagnosis Date Noted  . Dislocation of right knee 12/11/2017  . Urticaria 11/11/2017  . MDD (major depressive disorder), recurrent episode, moderate (HCC) 02/20/2017  . Patellar dislocation 01/25/2015    Guss Bunde, PT, DPT 12/24/2017, 5:53 PM  [facility] [address] [place], [place], [address] Phone: [phone]   Fax:  [phone]  Name: MAKINA SKOW MRN: [id number] Date of Birth: 12-08-1995

## 2017-12-25 ENCOUNTER — Encounter: Payer: Self-pay | Admitting: Physical Therapy

## 2017-12-25 ENCOUNTER — Ambulatory Visit: Payer: 59 | Admitting: Physical Therapy

## 2017-12-25 DIAGNOSIS — M25561 Pain in right knee: Secondary | ICD-10-CM | POA: Diagnosis not present

## 2017-12-25 DIAGNOSIS — M25661 Stiffness of right knee, not elsewhere classified: Secondary | ICD-10-CM

## 2017-12-25 NOTE — Therapy (Signed)
[facility] [address] [place], [place], [address] Phone: [phone]   Fax:  [phone]  Physical Therapy Treatment  Patient Details  Name: Stacey Gonzalez MRN: [id number] Date of Birth: 12/05/1995 Referring Provider: Malon KindleSteven Norris MD.   Encounter Date: 12/25/2017  PT End of Session - 12/25/17 1352    Visit Number  4    Number of Visits  12    Date for PT Re-Evaluation  01/13/18    PT Start Time  1350    PT Stop Time  1433    PT Time Calculation (min)  43 min    Activity Tolerance  Patient tolerated treatment well    Behavior During Therapy  [facility] for tasks assessed/performed       History reviewed. No pertinent past medical history.  History reviewed. No pertinent surgical history.  There were no vitals filed for this visit.  Subjective Assessment - 12/25/17 1351    Subjective  Reports that she fell on her knee after yesterday's PT session so she has had some discomfort.    Pertinent History  Previous left knee patellar dislocation.    How long can you stand comfortably?  15 minutes+.    How long can you walk comfortably?  Short community distances.    Diagnostic tests  X-ray.    Patient Stated Goals  Get out of pain and back to normal.    Currently in Pain?  Yes    Pain Score  5     Pain Location  Knee    Pain Orientation  Right    Pain Descriptors / Indicators  Sore    Pain Type  Acute pain    Pain Onset  1 to 4 weeks ago         [facility] - 12/25/17 0001      Assessment   Medical Diagnosis  Pain in right knee.    Onset Date/Surgical Date  11/29/17    Next MD Visit  If needed      Restrictions   Weight Bearing Restrictions  No                   [facility] Adult PT Treatment/Exercise - 12/25/17 0001      Knee/Hip Exercises: Aerobic   Nustep  L5 x10 min LEs only      Knee/Hip Exercises: Supine   Quad Sets  Strengthening;Right;3 sets;10 reps 5 sec hold    Short Arc Quad Sets  AROM;Right;3  sets;10 reps    Hip Adduction Isometric  Strengthening;Both;2 sets;10 reps    Other Supine Knee/Hip Exercises  hamstring isometric digs 5" hold x20 reps      Modalities   Modalities  Vasopneumatic      Vasopneumatic   Number Minutes Vasopneumatic   10 minutes    Vasopnuematic Location   Knee    Vasopneumatic Pressure  Medium    Vasopneumatic Temperature   34                  PT Long Term Goals - 12/16/17 1606      PT LONG TERM GOAL #1   Title  Ind with HEP.    Time  4    Period  Weeks    Status  New      PT LONG TERM GOAL #2   Title  Active right knee flexion= 115 degrees.    Time  4    Period  Weeks    Status  New      PT LONG TERM GOAL #3   Title  Perform ADL's with pain not > 2-3/10.    Time  4    Period  Weeks    Status  New      PT LONG TERM GOAL #4   Title  Walk a community distance with pain not > 2-3/10.    Time  4    Period  Weeks    Status  New            Plan - 12/25/17 1437    Clinical Impression Statement  Patient tolerated today's treatment well with only complaints of soreness in R knee upon arrival in clinic. Patient fell yesterday after tripping over a mat. No complaints with any supine exercises today. Good R quad activation noted with QS and SAQ. Normal vasopneumatic response noted following removal of the modality.    Rehab Potential  Excellent    PT Frequency  3x / week    PT Duration  4 weeks    PT Treatment/Interventions  ADLs/Self Care Home Management;Cryotherapy;Electrical Stimulation;Therapeutic activities;Therapeutic exercise;Neuromuscular re-education;Patient/family education;Passive range of motion    Consulted and Agree with Plan of Care  Patient       Patient will benefit from skilled therapeutic intervention in order to improve the following deficits and impairments:  Decreased activity tolerance, Pain, Decreased range of motion  Visit Diagnosis: Acute pain of right knee  Stiffness of right knee, not elsewhere  classified     Problem List Patient Active Problem List   Diagnosis Date Noted  . Dislocation of right knee 12/11/2017  . Urticaria 11/11/2017  . MDD (major depressive disorder), recurrent episode, moderate (HCC) 02/20/2017  . Patellar dislocation 01/25/2015    Marvell Fuller, PTA 12/25/2017, 2:46 PM  [facility] [address] [place], [place], [address] Phone: [phone]   Fax:  [phone]  Name: Stacey Gonzalez MRN: [id number] Date of Birth: 14-Nov-1995

## 2017-12-30 ENCOUNTER — Encounter: Payer: Self-pay | Admitting: Physical Therapy

## 2017-12-30 ENCOUNTER — Ambulatory Visit: Payer: 59 | Admitting: Physical Therapy

## 2017-12-30 DIAGNOSIS — M25561 Pain in right knee: Secondary | ICD-10-CM | POA: Diagnosis not present

## 2017-12-30 DIAGNOSIS — M25661 Stiffness of right knee, not elsewhere classified: Secondary | ICD-10-CM | POA: Diagnosis not present

## 2017-12-30 NOTE — Therapy (Signed)
[facility] [address] [place], [place], [address] Phone: [phone]   Fax:  [phone]  Physical Therapy Treatment  Patient Details  Name: Stacey Gonzalez MRN: [id number] Date of Birth: 09/03/1995 Referring Provider: Malon KindleSteven Norris MD.   Encounter Date: 12/30/2017  PT End of Session - 12/30/17 1418    Visit Number  5    Number of Visits  12    Date for PT Re-Evaluation  01/13/18    PT Start Time  0103    PT Stop Time  0157    PT Time Calculation (min)  54 min       History reviewed. No pertinent past medical history.  History reviewed. No pertinent surgical history.  There were no vitals filed for this visit.  Subjective Assessment - 12/30/17 1420    Subjective  I'm back to full duty work now and i think it is helping.    Pain Score  5     Pain Location  Knee    Pain Orientation  Right    Pain Descriptors / Indicators  Sore    Pain Type  Acute pain    Pain Onset  1 to 4 weeks ago                       [facility] Treatment/Exercise - 12/30/17 0001      Exercises   Exercises  Knee/Hip      Knee/Hip Exercises: Aerobic   Nustep  Level 5 x 15 minutes moving seat forward x 1 to increase knee flexion.      Knee/Hip Exercises: Supine   Short Arc Quad Sets Limitations  5# x 6 minutes.      Modalities   Modalities  Estate agentlectrical Stimulation;Vasopneumatic      Electrical Stimulation   Electrical Stimulation Location  Right knee.    Electrical Stimulation Action  Pre-mod.    Electrical Stimulation Parameters  80-150 Hz x 15 minutes.    Electrical Stimulation Goals  Pain      Vasopneumatic   Number Minutes Vasopneumatic   15 minutes    Vasopnuematic Location   --   Right knee.   Vasopneumatic Pressure  Medium      Manual Therapy   Passive ROM  2 minutes right knee flexion stretch in supine.                  PT Long Term Goals - 12/16/17 1606      PT LONG TERM GOAL #1   Title  Ind with  HEP.    Time  4    Period  Weeks    Status  New      PT LONG TERM GOAL #2   Title  Active right knee flexion= 115 degrees.    Time  4    Period  Weeks    Status  New      PT LONG TERM GOAL #3   Title  Perform ADL's with pain not > 2-3/10.    Time  4    Period  Weeks    Status  New      PT LONG TERM GOAL #4   Title  Walk a community distance with pain not > 2-3/10.    Time  4    Period  Weeks    Status  New            Plan - 12/30/17 1434    Clinical Impression  Statement  Patient did very well today with right knee range of motion essentially full today.      PT Treatment/Interventions  ADLs/Self Care Home Management;Cryotherapy;Electrical Stimulation;Therapeutic activities;Therapeutic exercise;Neuromuscular re-education;Patient/family education;Passive range of motion    Consulted and Agree with Plan of Care  Patient       Patient will benefit from skilled therapeutic intervention in order to improve the following deficits and impairments:  Decreased activity tolerance, Pain, Decreased range of motion  Visit Diagnosis: Acute pain of right knee  Stiffness of right knee, not elsewhere classified     Problem List Patient Active Problem List   Diagnosis Date Noted  . Dislocation of right knee 12/11/2017  . Urticaria 11/11/2017  . MDD (major depressive disorder), recurrent episode, moderate (HCC) 02/20/2017  . Patellar dislocation 01/25/2015    Remijio Holleran, [place] MPT 12/30/2017, 2:49 PM  [facility] [address] [place], [place], [address] Phone: [phone]   Fax:  [phone]  Name: Stacey Brenneria M Noffsinger MRN: [id number] Date of Birth: 02/02/1996

## 2018-01-01 ENCOUNTER — Encounter: Payer: Self-pay | Admitting: Physical Therapy

## 2018-01-02 ENCOUNTER — Encounter: Payer: Self-pay | Admitting: Physical Therapy

## 2018-01-02 ENCOUNTER — Ambulatory Visit: Payer: 59 | Admitting: Physical Therapy

## 2018-01-02 DIAGNOSIS — M25561 Pain in right knee: Secondary | ICD-10-CM | POA: Diagnosis not present

## 2018-01-02 DIAGNOSIS — M25661 Stiffness of right knee, not elsewhere classified: Secondary | ICD-10-CM

## 2018-01-02 NOTE — Addendum Note (Signed)
Addended by: Guss BundeMANGAWANG, Kimberlynn Lumbra on: 01/02/2018 05:20 PM   Modules accepted: Orders

## 2018-01-02 NOTE — Therapy (Addendum)
[facility] [place], [place], [address] Phone: [phone]   Fax:  [phone]  Physical Therapy Treatment  Patient Details  Name: Stacey Gonzalez MRN: [id number] Date of Birth: 13-Oct-1995 Referring Provider: Esmond Plants MD.   Encounter Date: 01/02/2018  PT End of Session - 01/02/18 1352    Visit Number  6    Number of Visits  12    Date for PT Re-Evaluation  01/31/18    PT Start Time  1301    PT Stop Time  1359    PT Time Calculation (min)  58 min    Activity Tolerance  Patient tolerated treatment well    Behavior During Therapy  [facility] for tasks assessed/performed       History reviewed. No pertinent past medical history.  History reviewed. No pertinent surgical history.  There were no vitals filed for this visit.  Subjective Assessment - 01/02/18 1303    Subjective  Patient arrived with mild discomfort reported and improvement overall    Pertinent History  Previous left knee patellar dislocation.    How long can you stand comfortably?  15 minutes+.    How long can you walk comfortably?  Short community distances.    Diagnostic tests  X-ray.    Patient Stated Goals  Get out of pain and back to normal.    Currently in Pain?  Yes    Pain Score  2     Pain Location  Knee    Pain Orientation  Right    Pain Descriptors / Indicators  Sore    Pain Type  Acute pain    Pain Onset  1 to 4 weeks ago    Pain Frequency  Intermittent    Aggravating Factors   increased activity    Pain Relieving Factors  at rest         [facility] PT Assessment - 01/02/18 0001      ROM / Strength   AROM / PROM / Strength  AROM      AROM   AROM Assessment Site  Knee    Right/Left Knee  Right    Right Knee Flexion  120                   [facility] Adult PT Treatment/Exercise - 01/02/18 0001      Exercises   Exercises  Knee/Hip      Knee/Hip Exercises: Aerobic   Nustep  Level 5 x 15 min LE only      Knee/Hip Exercises:  Standing   Terminal Knee Extension  Strengthening;Right;20 reps;Theraband    Theraband Level (Terminal Knee Extension)  Level 2 (Red)      Knee/Hip Exercises: Supine   Short Arc Quad Sets  Strengthening;Right;2 sets;20 reps    Hip Adduction Isometric  Strengthening;20 reps;10 reps   grey ball   Other Supine Knee/Hip Exercises  clamshell with green t-band x30      Electrical Stimulation   Electrical Stimulation Location  Right knee.    Electrical Stimulation Action  premod    Printmaker Parameters  80-_0  x31mn    Electrical Stimulation Goals  Pain      Vasopneumatic   Number Minutes Vasopneumatic   15 minutes    Vasopnuematic Location   Knee    Vasopneumatic Pressure  Medium      Manual Therapy   Manual Therapy  Taping;Passive ROM    Manual therapy comments  manual taping for right knee to  prevent lateral dislocation and stabilize knee    Passive ROM  gentle PROM for right knee flexion with holds to improve ROM                  PT Long Term Goals - 01/02/18 1305      PT LONG TERM GOAL #1   Title  Ind with HEP.    Period  Weeks    Status  On-going      PT LONG TERM GOAL #2   Title  Active right knee flexion= 115 degrees.    Baseline  AROM 120 01/02/18 MET    Time  4    Period  Weeks    Status  Achieved      PT LONG TERM GOAL #3   Title  Perform ADL's with pain not > 2-3/10.    Time  4    Period  Weeks    Status  On-going      PT LONG TERM GOAL #4   Title  Walk a community distance with pain not > 2-3/10.    Time  4    Period  Weeks    Status  On-going      PT LONG TERM GOAL #5   Title  Perform all  ADL's and work activites without pain    Period  Weeks    Status  On-going            Plan - 01/02/18 1347    Clinical Impression Statement  Patient tolerated treatment well today. patient able to progress exercises for right knee stabilization. Patient reported no increased discomfort with exercises. Today added taping to right  knee per PT for stabilization. Patient reported her MD would like her to have bil knees in braces at work or she may learn how to tape her knees for work. Patient improved ROM and met LTG #2 today, remaining goals ongoing.     Rehab Potential  Excellent    PT Frequency  3x / week    PT Duration  4 weeks    PT Treatment/Interventions  ADLs/Self Care Home Management;Cryotherapy;Electrical Stimulation;Therapeutic activities;Therapeutic exercise;Neuromuscular re-education;Patient/family education;Passive range of motion;Taping    PT Next Visit Plan  assess knee taping and continue or modify accordingly. Cont with Right knee strength/stabilization and modalities PRN    Consulted and Agree with Plan of Care  Patient       Patient will benefit from skilled therapeutic intervention in order to improve the following deficits and impairments:  Decreased activity tolerance, Pain, Decreased range of motion  Visit Diagnosis: Acute pain of right knee  Stiffness of right knee, not elsewhere classified     Problem List Patient Active Problem List   Diagnosis Date Noted  . Dislocation of right knee 12/11/2017  . Urticaria 11/11/2017  . MDD (major depressive disorder), recurrent episode, moderate ([place]) 02/20/2017  . Patellar dislocation 93/81/0175   MD re-certification sent to add taping to plan of care as well as request a date extension to complete remaining visits. Gabriela Eves, PT, DPT   Phillips Climes, PTA 01/02/2018, 5:15 PM  [facility] [address] [place], [place], [address] Phone: [phone]   Fax:  [phone]  Name: Stacey Gonzalez MRN: [id number] Date of Birth: 1995/08/29

## 2018-01-07 ENCOUNTER — Ambulatory Visit: Payer: 59 | Admitting: Physical Therapy

## 2018-01-07 ENCOUNTER — Encounter: Payer: Self-pay | Admitting: Physical Therapy

## 2018-01-07 DIAGNOSIS — M25661 Stiffness of right knee, not elsewhere classified: Secondary | ICD-10-CM | POA: Diagnosis not present

## 2018-01-07 DIAGNOSIS — M25561 Pain in right knee: Secondary | ICD-10-CM

## 2018-01-07 NOTE — Therapy (Signed)
[facility] [place], [place], [address] Phone: [phone]   Fax:  [phone]  Physical Therapy Treatment  Patient Details  Name: Stacey Gonzalez MRN: [id number] Date of Birth: 1995/09/27 Referring Provider: Esmond Plants MD.   Encounter Date: 01/07/2018  PT End of Session - 01/07/18 1608    Visit Number  7    Number of Visits  12    Date for PT Re-Evaluation  01/31/18    PT Start Time  1601    PT Stop Time  1641    PT Time Calculation (min)  40 min    Activity Tolerance  Patient tolerated treatment well    Behavior During Therapy  [facility] for tasks assessed/performed       History reviewed. No pertinent past medical history.  History reviewed. No pertinent surgical history.  There were no vitals filed for this visit.  Subjective Assessment - 01/07/18 1604    Subjective  Patient reported only minimal discomfort at times but no recent dislocations. Patient reported that tape stayed for appoximately 2 days but began rolling up.    Pertinent History  Previous left knee patellar dislocation.    How long can you stand comfortably?  15 minutes+.    How long can you walk comfortably?  Short community distances.    Diagnostic tests  X-ray.    Patient Stated Goals  Get out of pain and back to normal.    Currently in Pain?  No/denies         [facility] PT Assessment - 01/07/18 0001      Assessment   Medical Diagnosis  Pain in right knee.    Onset Date/Surgical Date  11/29/17    Next MD Visit  If needed      Restrictions   Weight Bearing Restrictions  No                   [facility] Adult PT Treatment/Exercise - 01/07/18 0001      Exercises   Exercises  Knee/Hip      Knee/Hip Exercises: Aerobic   Nustep  Level 5 x 13 min LE only      Knee/Hip Exercises: Standing   Terminal Knee Extension  Strengthening;Theraband;3 sets;20 reps    Theraband Level (Terminal Knee Extension)  Level 3 (Green)    Hip Abduction   AROM;Right;2 sets;10 reps;Knee straight    Forward Step Up  Right;3 sets;10 reps;Hand Hold: 2;Step Height: 6"    Wall Squat  20 reps   mini squat     Knee/Hip Exercises: Seated   Long Arc Quad  Strengthening;Right;3 sets;10 reps;Weights    Long Arc Quad Weight  4 lbs.      Knee/Hip Exercises: Supine   Bridges with Ball Squeeze  20 reps    Straight Leg Raises  AROM;Right;2 sets;10 reps    Straight Leg Raise with External Rotation  AROM;Right;2 sets;10 reps      Knee/Hip Exercises: Prone   Hip Extension  AROM;Right;20 reps                  PT Long Term Goals - 01/02/18 1305      PT LONG TERM GOAL #1   Title  Ind with HEP.    Period  Weeks    Status  On-going      PT LONG TERM GOAL #2   Title  Active right knee flexion= 115 degrees.    Baseline  AROM 120 01/02/18 MET  Time  4    Period  Weeks    Status  Achieved      PT LONG TERM GOAL #3   Title  Perform ADL's with pain not > 2-3/10.    Time  4    Period  Weeks    Status  On-going      PT LONG TERM GOAL #4   Title  Walk a community distance with pain not > 2-3/10.    Time  4    Period  Weeks    Status  On-going      PT LONG TERM GOAL #5   Title  Perform all  ADL's and work activites without pain    Period  Weeks    Status  On-going            Plan - 01/07/18 1643    Clinical Impression Statement  Patient tolerated today's treatment well as she was progressed with strengthening exercises. Patient did well with all exercises and reported no popping or pain throughout treatment. Patient denied any observation of edema. No modalities completed as no pain reported during treatment.    Rehab Potential  Excellent    PT Frequency  3x / week    PT Duration  4 weeks    PT Treatment/Interventions  ADLs/Self Care Home Management;Cryotherapy;Electrical Stimulation;Therapeutic activities;Therapeutic exercise;Neuromuscular re-education;Patient/family education;Passive range of motion;Taping    PT Next Visit  Plan  Continue RLE strengthening per MPT POC.    Consulted and Agree with Plan of Care  Patient       Patient will benefit from skilled therapeutic intervention in order to improve the following deficits and impairments:  Decreased activity tolerance, Pain, Decreased range of motion  Visit Diagnosis: Acute pain of right knee  Stiffness of right knee, not elsewhere classified     Problem List Patient Active Problem List   Diagnosis Date Noted  . Dislocation of right knee 12/11/2017  . Urticaria 11/11/2017  . MDD (major depressive disorder), recurrent episode, moderate ([place]) 02/20/2017  . Patellar dislocation 01/25/2015    Standley Brooking, PTA 01/07/2018, 4:52 PM  [facility] [address] [place], [place], [address] Phone: [phone]   Fax:  [phone]  Name: Stacey Gonzalez MRN: [id number] Date of Birth: Mar 31, 1996

## 2018-01-08 ENCOUNTER — Encounter: Payer: Self-pay | Admitting: Physical Therapy

## 2018-01-08 ENCOUNTER — Ambulatory Visit: Payer: 59 | Admitting: Physical Therapy

## 2018-01-08 DIAGNOSIS — M25661 Stiffness of right knee, not elsewhere classified: Secondary | ICD-10-CM

## 2018-01-08 DIAGNOSIS — M25561 Pain in right knee: Secondary | ICD-10-CM

## 2018-01-08 NOTE — Therapy (Addendum)
[facility] [place], [place], [address] Phone: [phone]   Fax:  [phone]  Physical Therapy Treatment  Patient Details  Name: Stacey Gonzalez MRN: [id number] Date of Birth: 1995/07/16 Referring Provider: Esmond Plants MD.   Encounter Date: 01/08/2018  PT End of Session - 01/08/18 1452    Visit Number  8    Number of Visits  12    Date for PT Re-Evaluation  01/31/18    PT Start Time  0017    PT Stop Time  1528    PT Time Calculation (min)  41 min    Activity Tolerance  Patient tolerated treatment well    Behavior During Therapy  [facility] for tasks assessed/performed       History reviewed. No pertinent past medical history.  History reviewed. No pertinent surgical history.  There were no vitals filed for this visit.  Subjective Assessment - 01/08/18 1452    Subjective  Patient reported doing well after last treatment    How long can you stand comfortably?  15 minutes+.    How long can you walk comfortably?  Short community distances.    Diagnostic tests  X-ray.    Patient Stated Goals  Get out of pain and back to normal.    Currently in Pain?  Yes                       [facility] Adult PT Treatment/Exercise - 01/08/18 0001      Knee/Hip Exercises: Aerobic   Nustep  Level 5 x 10 min LE only      Knee/Hip Exercises: Standing   Terminal Knee Extension  Strengthening;Theraband;3 sets;20 reps;Limitations    Theraband Level (Terminal Knee Extension)  Other (comment)    Terminal Knee Extension Limitations  pink XTS    Wall Squat  20 reps;10 reps   mini squat     Knee/Hip Exercises: Seated   Long Arc Quad  Strengthening;Right;3 sets;10 reps;Weights;Limitations    Long Arc Quad Weight  4 lbs.    Long [employer] Limitations  with ball squeeze for VMO stabilization/activation      Knee/Hip Exercises: Supine   Bridges with [employer]  20 reps    Straight Leg Raises  AROM;Right;2 sets;10 reps    Straight  Leg Raise with External Rotation  AROM;Right;2 sets;10 reps             PT Education - 01/08/18 1521    Education Details  HEP green t-band    Person(s) Educated  Patient    Methods  Explanation;Demonstration;Handout    Comprehension  Verbalized understanding;Returned demonstration          PT Long Term Goals - 01/08/18 1456      PT LONG TERM GOAL #1   Title  Ind with HEP.    Baseline  MET 01/08/18    Time  4    Period  Weeks    Status  Achieved      PT LONG TERM GOAL #2   Title  Active right knee flexion= 115 degrees.    Baseline  AROM 120 01/02/18 MET    Time  4    Period  Weeks    Status  Achieved      PT LONG TERM GOAL #3   Title  Perform ADL's with pain not > 2-3/10.    Baseline  no pain MET 01/08/18    Time  4    Period  Weeks    Status  Achieved      PT LONG TERM GOAL #4   Title  Walk a community distance with pain not > 2-3/10.    Baseline  no pain MET 01/08/18    Time  4    Period  Weeks    Status  Achieved      PT LONG TERM GOAL #5   Title  Perform all  ADL's and work activites without pain    Time  2    Period  Weeks    Status  Partially Met   stiffness 1/10 after work 01/08/18           Plan - 01/08/18 1523    Clinical Impression Statement  Patient tolerated treatment well and reported 100% improvement overall. Patient able to complete all exercises pain free. HEP proveded as she would like to be put on hold. Patient met all goals except partial goal met. Patient only has a slight stiff pain 1/10 with last 20 min of her work activity.     Rehab Potential  Excellent    PT Frequency  3x / week    PT Duration  4 weeks    PT Treatment/Interventions  ADLs/Self Care Home Management;Cryotherapy;Electrical Stimulation;Therapeutic activities;Therapeutic exercise;Neuromuscular re-education;Patient/family education;Passive range of motion;Taping    PT Next Visit Plan  on hold per patient    Consulted and Agree with Plan of Care  Patient        Patient will benefit from skilled therapeutic intervention in order to improve the following deficits and impairments:  Decreased activity tolerance, Pain, Decreased range of motion  Visit Diagnosis: Acute pain of right knee  Stiffness of right knee, not elsewhere classified     Problem List Patient Active Problem List   Diagnosis Date Noted  . Dislocation of right knee 12/11/2017  . Urticaria 11/11/2017  . MDD (major depressive disorder), recurrent episode, moderate ([place]) 02/20/2017  . Patellar dislocation 01/25/2015    Phillips Climes, PTA 01/08/2018, 3:28 PM  [facility] [place], [place], [address] Phone: [phone]   Fax:  [phone]  Name: Stacey Gonzalez MRN: [id number] Date of Birth: [birth year]  PHYSICAL THERAPY DISCHARGE SUMMARY  Visits from Start of Care: 8.  Current functional level related to goals / functional outcomes: See above.   Remaining deficits: Goals essentially met.   Education / Equipment: HEP Plan: Patient agrees to discharge.  Patient goals were met. Patient is being discharged due to meeting the stated rehab goals.  ?????         Mali Applegate MPT

## 2018-01-08 NOTE — Patient Instructions (Signed)
  Knee Extension (Sitting)   Place __0-3__ pound weight on left ankle and straighten knee fully, lower slowly. Repeat _10___ times per set. Do __2-3__ sets per session. Do __2-3__ sessions per day.  Strengthening: Hip Abduction (Side-Lying)  Strengthening: Straight Leg Raise (Phase 1)  Repeat _10___ times per set. Do __2__ sets per session. Do __2__ sessions per day.  Pelvic Tilt: Posterior - Legs Bent (Supine)  Bridging   Slowly raise buttocks from floor, keeping stomach tight. Repeat _10___ times per set. Do __2__ sets per session. Do __2__ sessions per day.   Straight Leg Raise   Tighten stomach and slowly raise locked right leg __4__ inches from floor. Repeat __10-30__ times per set. Do __2__ sets per session. Do __2__ sessions per day.   Terminal Knee Extension (Standing)    Facing anchor with right knee slightly bent and tubing just above knee, gently pull knee back straight. Do not overextend knee. Repeat _10___ times per set. Do __1-3__ sets per session. Do _1-2___ sessions per day.

## 2018-04-08 ENCOUNTER — Other Ambulatory Visit (HOSPITAL_COMMUNITY): Payer: Self-pay | Admitting: Psychiatry

## 2018-04-08 MED ORDER — SERTRALINE HCL 100 MG PO TABS: 100.0000 mg | ORAL_TABLET | Freq: Every day | ORAL | 0 refills | Status: DC

## 2018-04-10 DIAGNOSIS — Z01419 Encounter for gynecological examination (general) (routine) without abnormal findings: Secondary | ICD-10-CM | POA: Diagnosis not present

## 2018-06-26 ENCOUNTER — Encounter (HOSPITAL_COMMUNITY): Payer: Self-pay

## 2018-06-26 ENCOUNTER — Emergency Department (HOSPITAL_COMMUNITY)
Admission: EM | Admit: 2018-06-26 | Discharge: 2018-06-26 | Disposition: A | Discharge status: Home / Self Care | Payer: PRIVATE HEALTH INSURANCE | Attending: Emergency Medicine | Admitting: Emergency Medicine

## 2018-06-26 DIAGNOSIS — Z91040 Latex allergy status: Secondary | ICD-10-CM | POA: Insufficient documentation

## 2018-06-26 DIAGNOSIS — W503XXA Accidental bite by another person, initial encounter: Secondary | ICD-10-CM | POA: Insufficient documentation

## 2018-06-26 DIAGNOSIS — Z79899 Other long term (current) drug therapy: Secondary | ICD-10-CM | POA: Insufficient documentation

## 2018-06-26 DIAGNOSIS — S61452A Open bite of left hand, initial encounter: Secondary | ICD-10-CM

## 2018-06-26 DIAGNOSIS — Y9389 Activity, other specified: Secondary | ICD-10-CM | POA: Diagnosis not present

## 2018-06-26 DIAGNOSIS — S60572A Other superficial bite of hand of left hand, initial encounter: Secondary | ICD-10-CM | POA: Insufficient documentation

## 2018-06-26 DIAGNOSIS — Y990 Civilian activity done for income or pay: Secondary | ICD-10-CM | POA: Diagnosis not present

## 2018-06-26 DIAGNOSIS — F329 Major depressive disorder, single episode, unspecified: Secondary | ICD-10-CM | POA: Insufficient documentation

## 2018-06-26 DIAGNOSIS — Y92239 Unspecified place in hospital as the place of occurrence of the external cause: Secondary | ICD-10-CM | POA: Diagnosis not present
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
## 2018-07-07 ENCOUNTER — Telehealth (HOSPITAL_COMMUNITY): Payer: Self-pay | Admitting: Psychiatry

## 2018-07-07 MED ORDER — SERTRALINE HCL 100 MG PO TABS: 100.0000 mg | ORAL_TABLET | Freq: Every day | ORAL | 0 refills | Status: DC

## 2018-07-07 NOTE — Telephone Encounter (Signed)
.  Ordered refill for sertraline per request. Please contact the patient to make follow up appointment. Will not plan to prescribe any refill without evaluation.

## 2018-07-07 NOTE — Telephone Encounter (Signed)
LVM per Provider Dr Vanetta Shawl -- contact the patient to make follow up appointment. Will not plan to prescribe any refill without evaluation.

## 2018-10-29 IMAGING — CR DG KNEE COMPLETE 4+V*R*
4 series · 4 of 4 positions shown · non-contrast
Comparison: None.

CLINICAL DATA: Knee pain after fall

EXAM:
RIGHT KNEE - COMPLETE 4+ VIEW

[x knee ap right]
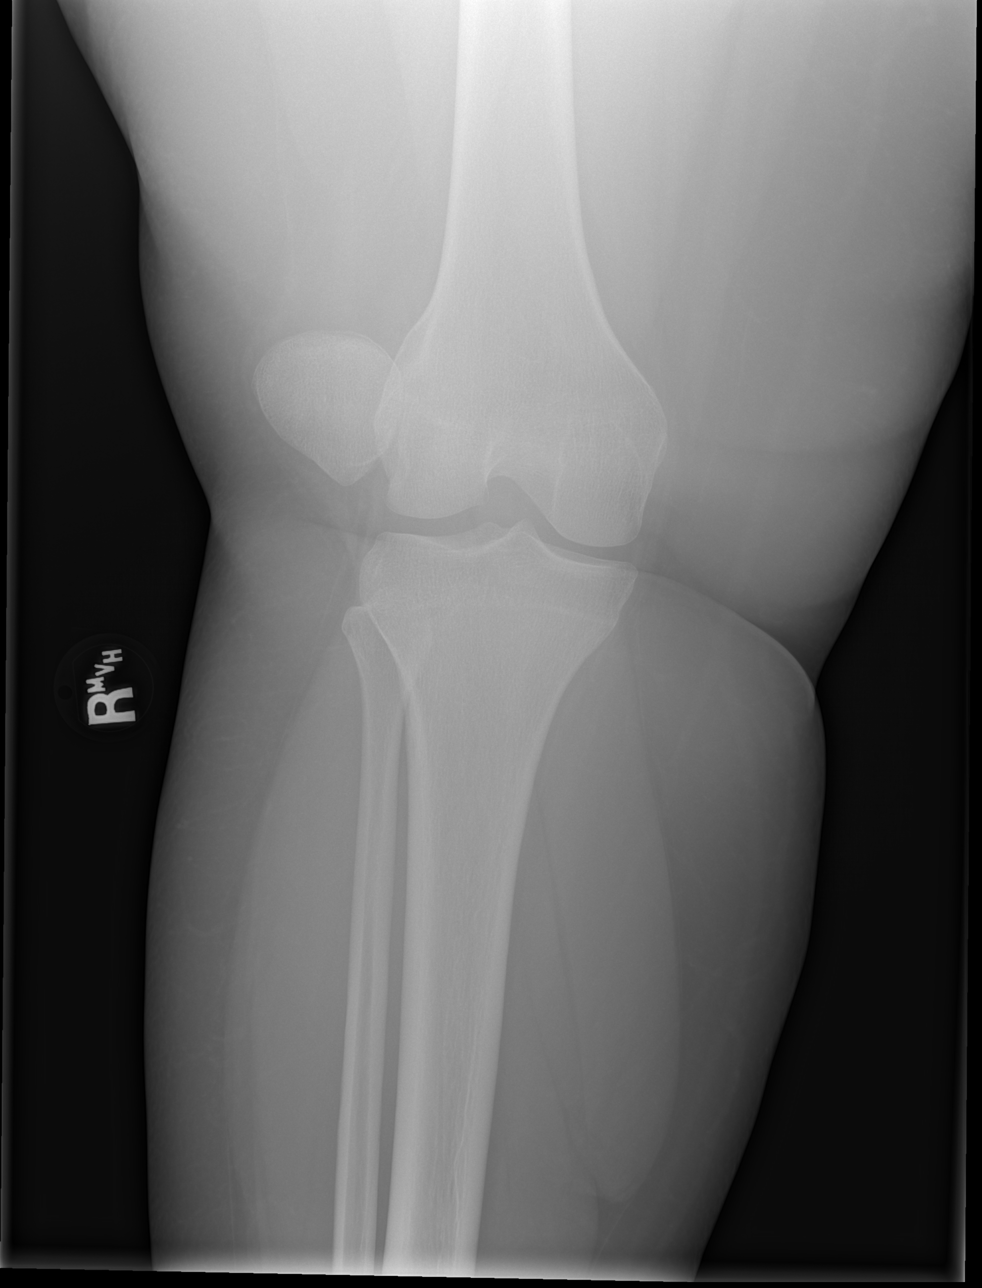

[x knee obl right (1 of 2)]
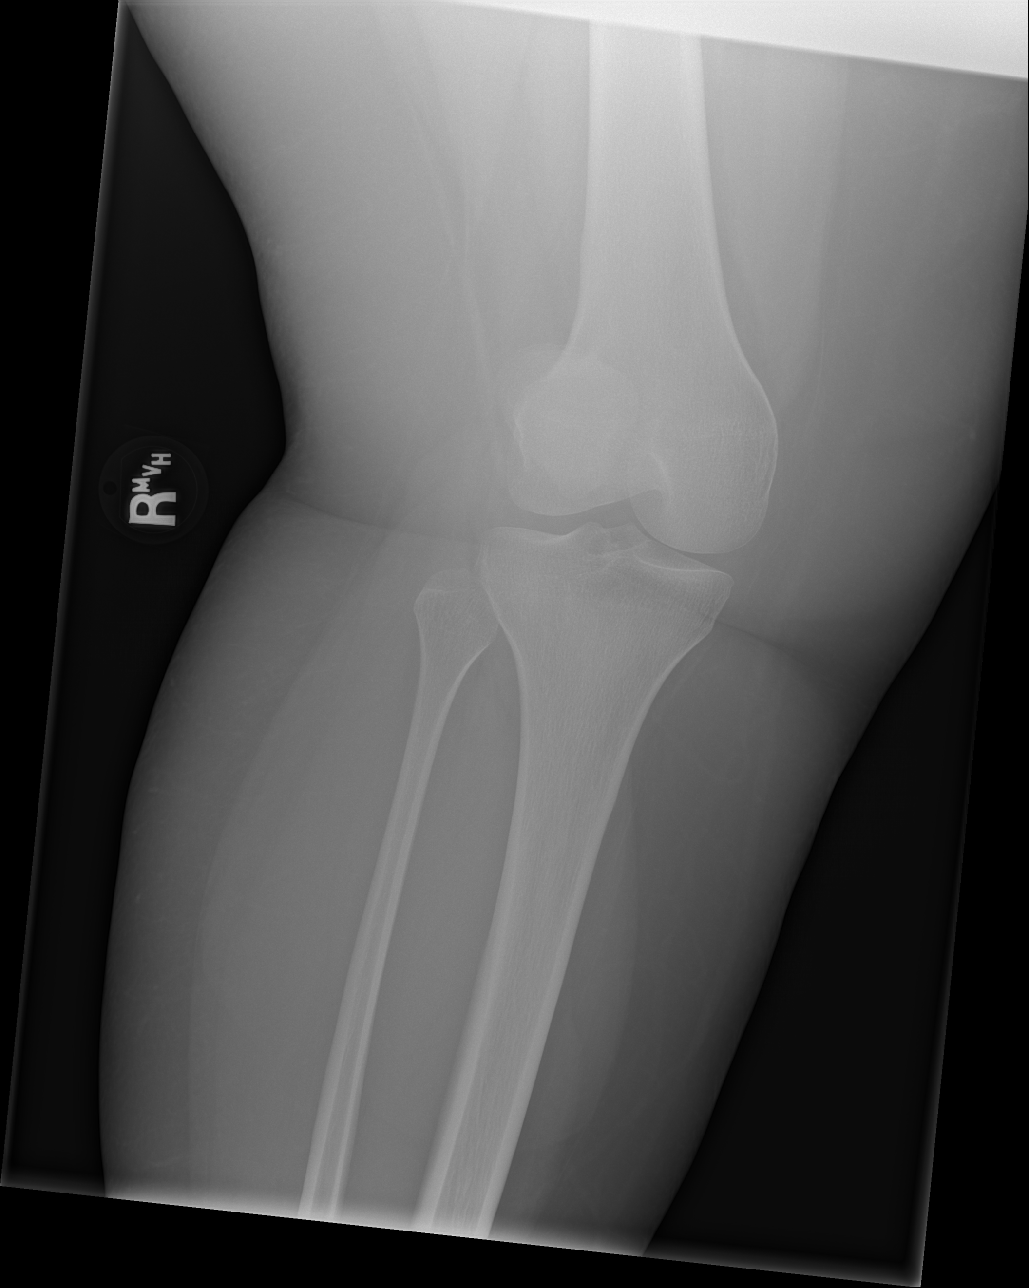

[x knee obl right (2 of 2)]
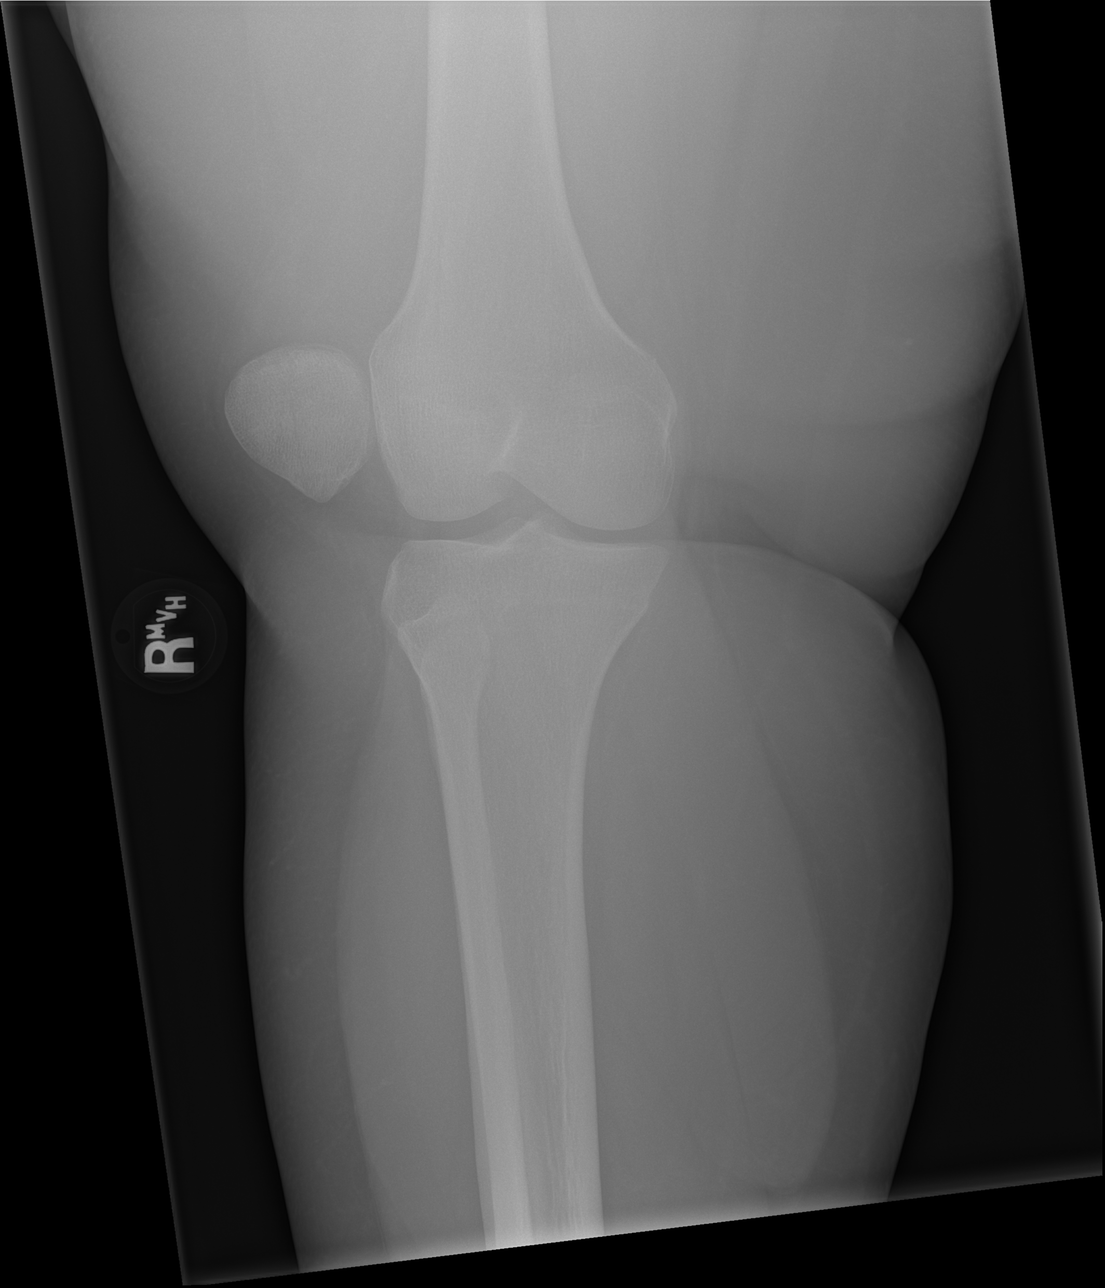

[x knee lat right]
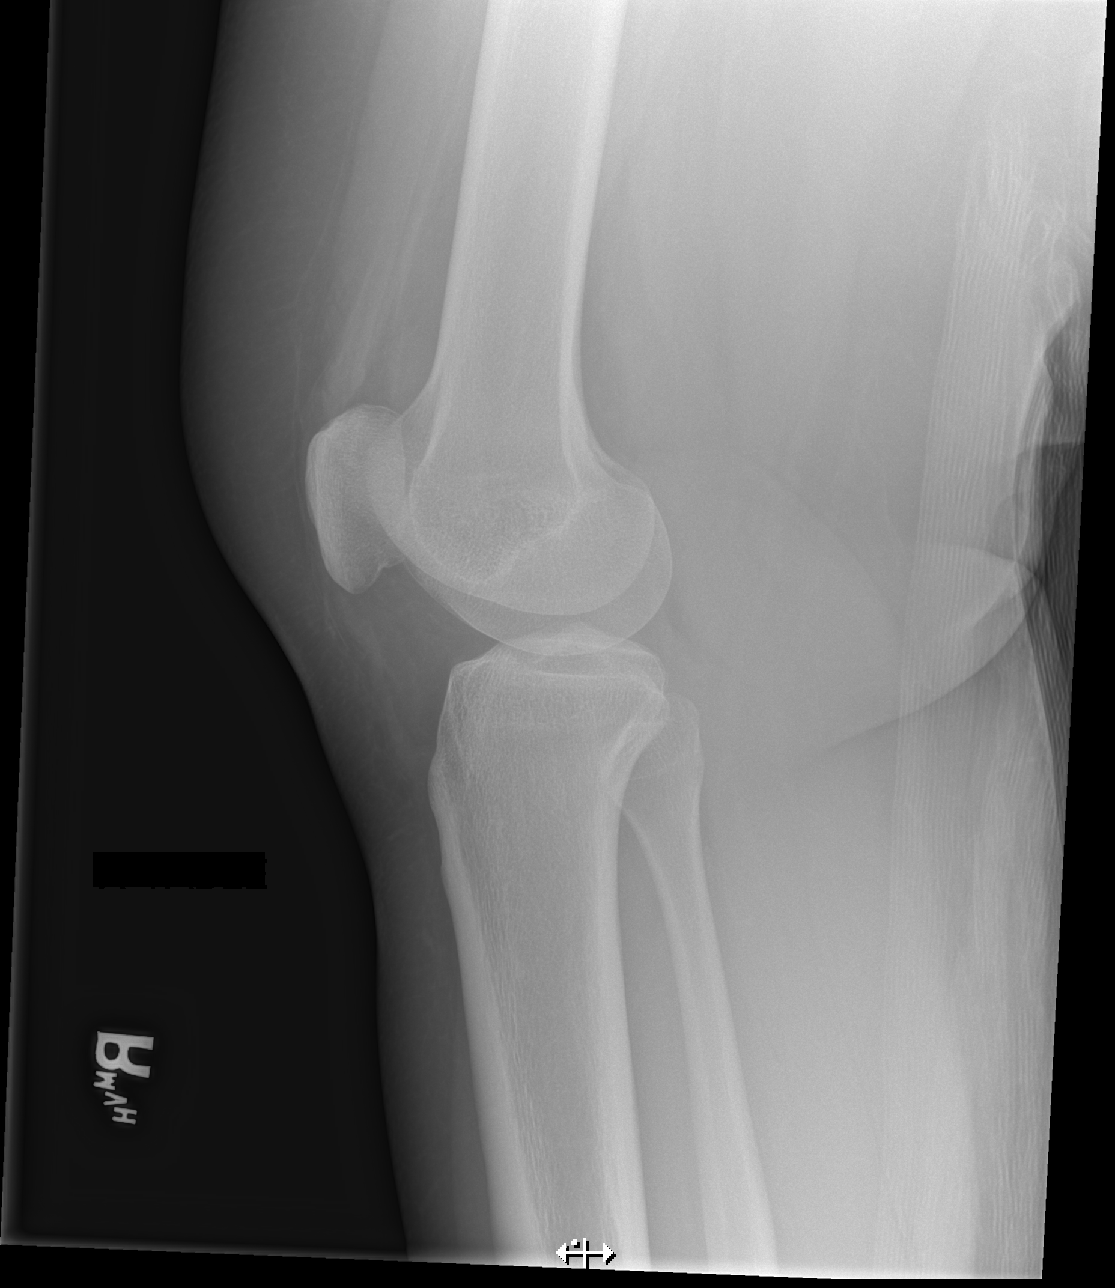

[4 of 4 positions shown; findings below may reference images not displayed]

FINDINGS: Lateral patellar dislocation without apparent fracture. Trace fluid
in the suprapatellar compartment. The femorotibial compartment is
maintained.
IMPRESSION: Lateral dislocation of the patella.

## 2018-11-20 ENCOUNTER — Other Ambulatory Visit: Payer: Self-pay | Admitting: Physician Assistant

## 2018-11-20 DIAGNOSIS — L509 Urticaria, unspecified: Secondary | ICD-10-CM

## 2018-12-08 DIAGNOSIS — H5213 Myopia, bilateral: Secondary | ICD-10-CM | POA: Diagnosis not present

## 2018-12-15 ENCOUNTER — Encounter: Payer: Self-pay | Admitting: Physician Assistant

## 2018-12-15 ENCOUNTER — Other Ambulatory Visit: Payer: Self-pay

## 2018-12-15 ENCOUNTER — Ambulatory Visit (INDEPENDENT_AMBULATORY_CARE_PROVIDER_SITE_OTHER): Payer: 59 | Admitting: Physician Assistant

## 2018-12-15 DIAGNOSIS — K580 Irritable bowel syndrome with diarrhea: Secondary | ICD-10-CM | POA: Diagnosis not present

## 2018-12-15 DIAGNOSIS — K589 Irritable bowel syndrome without diarrhea: Secondary | ICD-10-CM | POA: Insufficient documentation

## 2018-12-15 MED ORDER — HYOSCYAMINE SULFATE 0.125 MG PO TABS: 0.1250 mg | ORAL_TABLET | Freq: Four times a day (QID) | ORAL | 5 refills | Status: DC | PRN

## 2018-12-15 NOTE — Progress Notes (Signed)
     Telephone visit  Subjective: TD:[id number] PCP: Chevis Pretty, FNP HPI:Stacey Gonzalez is a 23 y.o. female calls for telephone consult today. Patient provides verbal consent for consult held via phone.  Patient is identified with 2 separate identifiers.  At this time the entire area is on COVID-19 social distancing and stay home orders are in place.  Patient is of higher risk and therefore we are performing this by a virtual method.  Location of patient: home Location of provider: [facility] Others present for call: no  This patient has a complaint of having a blood of weakness related to diarrhea and irritable bowel symptoms.  She will daily have an episode where she gets cramping and diarrhea.  She denies seeing any blood.  She cannot exactly predict any particular food or anything can make it happen and so vascular try to keep diet to see if there is anything that seems to aggravate her.  She is never been treated before.  So we have discussed Levsin as a medicine for as needed symptoms of IBS diarrhea.  And if things become much more regular she may need to be on Levbid.   ROS: Per HPI  Allergies  Allergen Reactions  . Kiwi Extract Anaphylaxis  . Lavender Oil Other (See Comments)    Hives (if far away), migraine nausea eye swelling (if close)  . Latex Rash  . Zithromax [Azithromycin] Rash   No past medical history on file.  Current Outpatient Medications:  .  cetirizine (ZYRTEC) 10 MG tablet, TAKE 1 TABLET BY MOUTH EVERY DAY, Disp: 90 tablet, Rfl: 3 .  hyoscyamine (LEVSIN) 0.125 MG tablet, Take 1 tablet (0.125 mg total) by mouth every 6 (six) hours as needed for cramping., Disp: 60 tablet, Rfl: 5 .  JUNEL FE 1.5/30 1.5-30 MG-MCG tablet, TAKE 1 TABLET BY MOUTH EVERY DAY, Disp: 28 tablet, Rfl: 13 .  loratadine (CLARITIN) 10 MG tablet, Take 1 tablet (10 mg total) by mouth daily., Disp: 30 tablet, Rfl: 11 .  sertraline (ZOLOFT) 100 MG tablet, Take 1 tablet (100 mg total) by  mouth daily., Disp: 90 tablet, Rfl: 0 .  traZODone (DESYREL) 50 MG tablet, 25-50 mg at night as needed for sleep, Disp: 30 tablet, Rfl: 0  Assessment/ Plan: 23 y.o. female   1. Irritable bowel syndrome with diarrhea - hyoscyamine (LEVSIN) 0.125 MG tablet; Take 1 tablet (0.125 mg total) by mouth every 6 (six) hours as needed for cramping.  Dispense: 60 tablet; Refill: 5   No follow-ups on file.  Continue all other maintenance medications as listed above.  Start time: 5:16 PM End time: 5:28 PM  Meds ordered this encounter  Medications  . hyoscyamine (LEVSIN) 0.125 MG tablet    Sig: Take 1 tablet (0.125 mg total) by mouth every 6 (six) hours as needed for cramping.    Dispense:  60 tablet    Refill:  5    Order Specific Question:   Supervising Provider    Answer:   Janora Norlander [[id number]]    Particia Nearing PA-C [place] [phone]

## 2018-12-15 NOTE — Patient Instructions (Signed)
Diet for Irritable Bowel Syndrome When you have irritable bowel syndrome (IBS), it is very important to eat the foods and follow the eating habits that are best for your condition. IBS may cause various symptoms such as pain in the abdomen, constipation, or diarrhea. Choosing the right foods can help to ease the discomfort from these symptoms. Work with your health care provider and diet and nutrition specialist (dietitian) to find the eating plan that will help to control your symptoms. What are tips for following this plan?      Keep a food diary. This will help you identify foods that cause symptoms. Write down: ? What you eat and when you eat it. ? What symptoms you have. ? When symptoms occur in relation to your meals, such as "pain in abdomen 2 hours after dinner."  Eat your meals slowly and in a relaxed setting.  Aim to eat 5-6 small meals per day. Do not skip meals.  Drink enough fluid to keep your urine pale yellow.  Ask your health care provider if you should take an over-the-counter probiotic to help restore healthy bacteria in your gut (digestive tract). ? Probiotics are foods that contain good bacteria and yeasts.  Your dietitian may have specific dietary recommendations for you based on your symptoms. He or she may recommend that you: ? Avoid foods that cause symptoms. Talk with your dietitian about other ways to get the same nutrients that are in those problem foods. ? Avoid foods with gluten. Gluten is a protein that is found in rye, wheat, and barley. ? Eat more foods that contain soluble fiber. Examples of foods with high soluble fiber include oats, seeds, and certain fruits and vegetables. Take a fiber supplement if directed by your dietitian. ? Reduce or avoid certain foods called FODMAPs. These are foods that contain carbohydrates that are hard to digest. Ask your doctor which foods contain these carbohydrates. What foods are not recommended? The following are some  foods and drinks that may make your symptoms worse:  Fatty foods, such as french fries.  Foods that contain gluten, such as pasta and cereal.  Dairy products, such as milk, cheese, and ice cream.  Chocolate.  Alcohol.  Products with caffeine, such as coffee.  Carbonated drinks, such as soda.  Foods that are high in FODMAPs. These include certain fruits and vegetables.  Products with sweeteners such as honey, high fructose corn syrup, sorbitol, and mannitol. The items listed above may not be a complete list of foods and beverages you should avoid. Contact a dietitian for more information. What foods are good sources of fiber? Your health care provider or dietitian may recommend that you eat more foods that contain fiber. Fiber can help to reduce constipation and other IBS symptoms. Add foods with fiber to your diet a little at a time so your body can get used to them. Too much fiber at one time might cause gas and swelling of your abdomen. The following are some foods that are good sources of fiber:  Berries, such as raspberries, strawberries, and blueberries.  Tomatoes.  Carrots.  Brown rice.  Oats.  Seeds, such as chia and pumpkin seeds. The items listed above may not be a complete list of recommended sources of fiber. Contact your dietitian for more options. Where to find more information  International Foundation for Functional Gastrointestinal Disorders: www.iffgd.org  National Institute of Diabetes and Digestive and Kidney Diseases: www.niddk.nih.gov Summary  When you have irritable bowel syndrome (IBS), it is   very important to eat the foods and follow the eating habits that are best for your condition.  IBS may cause various symptoms such as pain in the abdomen, constipation, or diarrhea.  Choosing the right foods can help to ease the discomfort that comes from symptoms.  Keep a food diary. This will help you identify foods that cause symptoms.  Your health  care provider or diet and nutrition specialist (dietitian) may recommend that you eat more foods that contain fiber. This information is not intended to replace advice given to you by your health care provider. Make sure you discuss any questions you have with your health care provider. Document Released: 07/28/2003 Document Revised: 08/27/2018 Document Reviewed: 01/08/2017 Elsevier Patient Education  2020 Elsevier Inc.  

## 2019-02-26 ENCOUNTER — Other Ambulatory Visit: Payer: Self-pay

## 2019-02-26 ENCOUNTER — Ambulatory Visit (INDEPENDENT_AMBULATORY_CARE_PROVIDER_SITE_OTHER): Payer: 59 | Admitting: Family

## 2019-02-26 ENCOUNTER — Encounter: Payer: Self-pay | Admitting: Family

## 2019-02-26 VITALS — BP 129/84 | HR 94 | Temp 98.4°F | Ht 65.0 in | Wt 263.0 lb

## 2019-02-26 DIAGNOSIS — L0291 Cutaneous abscess, unspecified: Secondary | ICD-10-CM | POA: Diagnosis not present

## 2019-02-26 MED ORDER — CEPHALEXIN 500 MG PO CAPS: 500.0000 mg | ORAL_CAPSULE | Freq: Three times a day (TID) | ORAL | 0 refills | Status: DC

## 2019-02-26 NOTE — Patient Instructions (Signed)
Skin Abscess  A skin abscess is an infected area on or under your skin that contains a collection of pus and other material. An abscess may also be called a furuncle, carbuncle, or boil. An abscess can occur in or on almost any part of your body. Some abscesses break open (rupture) on their own. Most continue to get worse unless they are treated. The infection can spread deeper into the body and eventually into your blood, which can make you feel ill. Treatment usually involves draining the abscess. What are the causes? An abscess occurs when germs, like bacteria, pass through your skin and cause an infection. This may be caused by:  A scrape or cut on your skin.  A puncture wound through your skin, including a needle injection or insect bite.  Blocked oil or sweat glands.  Blocked and infected hair follicles.  A cyst that forms beneath your skin (sebaceous cyst) and becomes infected. What increases the risk? This condition is more likely to develop in people who:  Have a weak body defense system (immune system).  Have diabetes.  Have dry and irritated skin.  Get frequent injections or use illegal IV drugs.  Have a foreign body in a wound, such as a splinter.  Have problems with their lymph system or veins. What are the signs or symptoms? Symptoms of this condition include:  A painful, firm bump under the skin.  A bump with pus at the top. This may break through the skin and drain. Other symptoms include:  Redness surrounding the abscess site.  Warmth.  Swelling of the lymph nodes (glands) near the abscess.  Tenderness.  A sore on the skin. How is this diagnosed? This condition may be diagnosed based on:  A physical exam.  Your medical history.  A sample of pus. This may be used to find out what is causing the infection.  Blood tests.  Imaging tests, such as an ultrasound, CT scan, or MRI. How is this treated? A small abscess that drains on its own may not  need treatment. Treatment for larger abscesses may include:  Moist heat or heat pack applied to the area several times a day.  A procedure to drain the abscess (incision and drainage).  Antibiotic medicines. For a severe abscess, you may first get antibiotics through an IV and then change to antibiotics by mouth. Follow these instructions at home: Medicines   Take over-the-counter and prescription medicines only as told by your health care provider.  If you were prescribed an antibiotic medicine, take it as told by your health care provider. Do not stop taking the antibiotic even if you start to feel better. Abscess care   If you have an abscess that has not drained, apply heat to the affected area. Use the heat source that your health care provider recommends, such as a moist heat pack or a heating pad. ? Place a towel between your skin and the heat source. ? Leave the heat on for 20-30 minutes. ? Remove the heat if your skin turns bright red. This is especially important if you are unable to feel pain, heat, or cold. You may have a greater risk of getting burned.  Follow instructions from your health care provider about how to take care of your abscess. Make sure you: ? Cover the abscess with a bandage (dressing). ? Change your dressing or gauze as told by your health care provider. ? Wash your hands with soap and water before you change the   dressing or gauze. If soap and water are not available, use hand sanitizer.  Check your abscess every day for signs of a worsening infection. Check for: ? More redness, swelling, or pain. ? More fluid or blood. ? Warmth. ? More pus or a bad smell. General instructions  To avoid spreading the infection: ? Do not share personal care items, towels, or hot tubs with others. ? Avoid making skin contact with other people.  Keep all follow-up visits as told by your health care provider. This is important. Contact a health care provider if you  have:  More redness, swelling, or pain around your abscess.  More fluid or blood coming from your abscess.  Warm skin around your abscess.  More pus or a bad smell coming from your abscess.  A fever.  Muscle aches.  Chills or a general ill feeling. Get help right away if you:  Have severe pain.  See red streaks on your skin spreading away from the abscess. Summary  A skin abscess is an infected area on or under your skin that contains a collection of pus and other material.  A small abscess that drains on its own may not need treatment.  Treatment for larger abscesses may include having a procedure to drain the abscess and taking an antibiotic. This information is not intended to replace advice given to you by your health care provider. Make sure you discuss any questions you have with your health care provider. Document Released: 02/14/2005 Document Revised: 08/28/2018 Document Reviewed: 06/20/2017 Elsevier Patient Education  2020 Elsevier Inc.  

## 2019-02-26 NOTE — Progress Notes (Signed)
   Subjective:    Patient ID: Stacey Gonzalez, female    DOB: 1996-03-11, 23 y.o.   MRN: [id number]  Chief Complaint  Patient presents with  . cyst on labia causing pain    HPI PT presents to the office today with an abscess on right labia that she noticed last Tuesaday, but then became larger on Wednesday. However, last night it started draining slightly. She reports 7 out 10  throbbing pain. She has taken tylenol with mild relief. She states the discharge was a thick white discharge.    Review of Systems  Skin: Positive for wound.  All other systems reviewed and are negative.      Objective:   Physical Exam Vitals signs reviewed.  Constitutional:      General: She is not in acute distress.    Appearance: She is well-developed.  HENT:     Head: Normocephalic and atraumatic.  Neck:     Musculoskeletal: Normal range of motion and neck supple.     Thyroid: No thyromegaly.  Cardiovascular:     Rate and Rhythm: Normal rate and regular rhythm.     Heart sounds: Normal heart sounds. No murmur.  Pulmonary:     Effort: Pulmonary effort is normal. No respiratory distress.     Breath sounds: Normal breath sounds. No wheezing.  Abdominal:     General: Bowel sounds are normal. There is no distension.     Palpations: Abdomen is soft.     Tenderness: There is no abdominal tenderness.  Genitourinary:      Comments: Small abscess, no discharge present. Musculoskeletal: Normal range of motion.        General: No tenderness.  Skin:    General: Skin is warm and dry.  Neurological:     Mental Status: She is alert and oriented to person, place, and time.     Cranial Nerves: No cranial nerve deficit.     Deep Tendon Reflexes: Reflexes are normal and symmetric.  Psychiatric:        Behavior: Behavior normal.        Thought Content: Thought content normal.        Judgment: Judgment normal.          BP 129/84   Pulse 94   Temp 98.4 F (36.9 C) (Temporal)   Ht 5\' 5"  (1.651 m)   Wt  263 lb (119.3 kg)   SpO2 99%   BMI 43.77 kg/m   Assessment & Plan:  Stacey Gonzalez comes in today with chief complaint of cyst on labia causing pain   Diagnosis and orders addressed:  1. Abscess Warm soaks and compresses Avoid squeezing Keep clean and dry  RTO if symptoms worsen or do not improve  - cephALEXin (KEFLEX) 500 MG capsule; Take 1 capsule (500 mg total) by mouth 3 (three) times daily.  Dispense: 21 capsule; Refill: 0   Evelina Dun, FNP

## 2019-04-15 DIAGNOSIS — Z800 Family history of malignant neoplasm of digestive organs: Secondary | ICD-10-CM | POA: Insufficient documentation

## 2019-04-15 DIAGNOSIS — Z3041 Encounter for surveillance of contraceptive pills: Secondary | ICD-10-CM | POA: Diagnosis not present

## 2019-04-15 DIAGNOSIS — Z01419 Encounter for gynecological examination (general) (routine) without abnormal findings: Secondary | ICD-10-CM | POA: Diagnosis not present

## 2019-04-15 DIAGNOSIS — N343 Urethral syndrome, unspecified: Secondary | ICD-10-CM | POA: Diagnosis not present

## 2019-05-04 MED FILL — ALYACEN 1-35-28 TABLET: 1-35 | 84 days supply | Qty: 84 | Fill #0

## 2019-08-03 MED FILL — ALYACEN 1-35-28 TABLET: 1-35 | 84 days supply | Qty: 84 | Fill #1

## 2019-08-06 ENCOUNTER — Telehealth: Payer: Self-pay | Admitting: Nurse Practitioner

## 2019-08-06 NOTE — Telephone Encounter (Signed)
  Medication Request  08/06/2019  What is the name of the medication? Hyoscyamine  Have you contacted your pharmacy to request a refill? No  Which pharmacy would you like this sent to? [facility]   Patient notified that their request is being sent to the clinical staff for review and that they should receive a call once it is complete. If they do not receive a call within 24 hours they can check with their pharmacy or our office.   Yetta Barre' pt.  DO NOT SEND TO:  [facility]  Please call pt.

## 2019-08-06 NOTE — Telephone Encounter (Signed)
Pt has refills just moved to [facility] Not on current list but pt does still take med

## 2019-08-07 MED ORDER — HYOSCYAMINE SULFATE 0.125 MG PO TABS: 0.1250 mg | ORAL_TABLET | ORAL | 1 refills | Status: DC | PRN

## 2019-08-07 MED FILL — OSCIMIN 0.125 MG TABLET: 0.125 | 5 days supply | Qty: 30 | Fill #0

## 2019-08-07 NOTE — Telephone Encounter (Signed)
levsin rx sent to pharmacy

## 2020-01-18 ENCOUNTER — Other Ambulatory Visit: Payer: Self-pay

## 2020-01-18 ENCOUNTER — Encounter (HOSPITAL_COMMUNITY): Payer: Self-pay

## 2020-01-18 ENCOUNTER — Emergency Department (HOSPITAL_COMMUNITY)
Admission: EM | Admit: 2020-01-18 | Discharge: 2020-01-18 | Disposition: A | Discharge status: Home / Self Care | Payer: 59 | Attending: Emergency Medicine | Admitting: Emergency Medicine

## 2020-01-18 DIAGNOSIS — Z91040 Latex allergy status: Secondary | ICD-10-CM | POA: Insufficient documentation

## 2020-01-18 DIAGNOSIS — M545 Low back pain: Secondary | ICD-10-CM | POA: Diagnosis present

## 2020-01-18 DIAGNOSIS — M62830 Muscle spasm of back: Secondary | ICD-10-CM | POA: Insufficient documentation

## 2020-01-18 HISTORY — DX: Scoliosis, unspecified: M41.9

## 2020-01-18 MED ORDER — METHOCARBAMOL 500 MG PO TABS: 500.0000 mg | ORAL_TABLET | Freq: Three times a day (TID) | ORAL | 0 refills | Status: DC | PRN

## 2020-01-18 MED ORDER — DICLOFENAC SODIUM 1 % EX GEL: 2.0000 g | Freq: Four times a day (QID) | CUTANEOUS | 0 refills | Status: DC | PRN

## 2020-01-18 NOTE — Discharge Instructions (Signed)
You have been seen in the Emergency Department (ED)  today for back pain.  Your workup and exam have not shown any acute abnormalities and you are likely suffering from muscle strain or possible problems with your discs, but there is no treatment that will fix your symptoms at this time.  Please take Motrin (ibuprofen) as needed for your pain according to the instructions written on the box.  Alternatively, for the next five days you can take 600mg  three times daily with meals (it may upset your stomach).  I have prescribed Robaxin. This medication can cause drowsiness and you should not drive while taking this medication. Do not drink alcohol with this medicine or take with other strong pain or anxiety medications.   Please follow up with your doctor as soon as possible regarding today's ED visit and your back pain.  Return to the ED for worsening back pain, fever, weakness or numbness of either leg, or if you develop either (1) an inability to urinate or have bowel movements, or (2) loss of your ability to control your bathroom functions (if you start having "accidents"), or if you develop other new symptoms that concern you.

## 2020-01-18 NOTE — ED Provider Notes (Signed)
Emergency Department Provider Note   I have reviewed the triage vital signs and the nursing notes.   HISTORY  Chief Complaint Back Pain   HPI Stacey Gonzalez is a 25 y.o. female with past medical history of scoliosis presents to the emergency department with intermittent back spasms starting yesterday.  Patient has had similar symptoms in the past.  She was sitting in class all day yesterday and reports developing symptoms shortly afterwards.  She has pain radiating into her hips from her midline lower back.  She is not having any urinary retention symptoms.  No bowel or bladder incontinence.  No groin numbness.  No fevers.  No weakness or numbness in the lower extremities or difficulty walking.  She is hoping to go back to work later this morning.  She is not having any urinary tract infection symptoms.  She states there is no chance she could be pregnant.  No radiation of symptoms or other modifying factors.   Past Medical History:  Diagnosis Date  . Scoliosis     Patient Active Problem List   Diagnosis Date Noted  . Dislocation of right knee 12/11/2017  . Urticaria 11/11/2017  . MDD (major depressive disorder), recurrent episode, moderate (HCC) 02/20/2017  . Patellar dislocation 01/25/2015    History reviewed. No pertinent surgical history.  Allergies Kiwi extract, Lavender oil, Latex, and Zithromax [azithromycin]  Family History  Problem Relation Age of Onset  . Cancer Mother        colorectal     Social History Social History   Tobacco Use  . Smoking status: Never Smoker  . Smokeless tobacco: Never Used  Vaping Use  . Vaping Use: Never used  Substance Use Topics  . Alcohol use: No  . Drug use: No    Review of Systems  Constitutional: No fever/chills Eyes: No visual changes. ENT: No sore throat. Cardiovascular: Denies chest pain. Respiratory: Denies shortness of breath. Gastrointestinal: No abdominal pain.  No nausea, no vomiting.  No diarrhea.  No  constipation. Genitourinary: Negative for dysuria. Musculoskeletal: Positive for back pain. Skin: Negative for rash. Neurological: Negative for headaches, focal weakness or numbness.  10-point ROS otherwise negative.  ____________________________________________   PHYSICAL EXAM:  VITAL SIGNS: ED Triage Vitals  Enc Vitals Group     BP 01/18/20 0751 138/90     Pulse Rate 01/18/20 0751 96     Resp 01/18/20 0751 18     Temp 01/18/20 0751 98.9 F (37.2 C)     Temp Source 01/18/20 0751 Oral     SpO2 01/18/20 0751 99 %     Weight 01/18/20 0749 259 lb (117.5 kg)     Height 01/18/20 0749 5\' 5"  (1.651 m)   Constitutional: Alert and oriented. Well appearing and in no acute distress. Eyes: Conjunctivae are normal.  Head: Atraumatic. Nose: No congestion/rhinnorhea. Mouth/Throat: Mucous membranes are moist.  Neck: No stridor. Cardiovascular: Normal rate, regular rhythm.  Respiratory: Normal respiratory effort.  Gastrointestinal: No distention.  Musculoskeletal: No gross deformities of extremities.  Patient with diffuse midline and paraspinal tenderness in the lumbar spine region.  No thoracic spine tenderness.  Neurologic:  Normal speech and language. No gross focal neurologic deficits are appreciated. Skin:  Skin is warm, dry and intact. No rash noted.  ____________________________________________  RADIOLOGY  None  ____________________________________________   PROCEDURES  Procedure(s) performed:   Procedures  None  ____________________________________________   INITIAL IMPRESSION / ASSESSMENT AND PLAN / ED COURSE  Pertinent labs & imaging results  that were available during my care of the patient were reviewed by me and considered in my medical decision making (see chart for details).   Patient presents emergency department evaluation of intermittent lower back pain with a spasm-like quality.  No red flag signs or symptoms to suspect acute spine emergency such as an  acute infectious process or critical spine stenosis/cauda equina.  Patient with history of scoliosis and similar symptoms in the past.  Will prescribe Robaxin and Voltaren to help with symptoms.  Not seen indication for additional tests or imaging on an emergent basis.  Patient to follow closely with her PCP.  Drowsy side effects of Robaxin discussed and provided in writing.   ____________________________________________  FINAL CLINICAL IMPRESSION(S) / ED DIAGNOSES  Final diagnoses:  Spasm of muscle of lower back    NEW OUTPATIENT MEDICATIONS STARTED DURING THIS VISIT:  New Prescriptions   DICLOFENAC SODIUM (VOLTAREN) 1 % GEL    Apply 2 g topically 4 (four) times daily as needed.   METHOCARBAMOL (ROBAXIN) 500 MG TABLET    Take 1 tablet (500 mg total) by mouth every 8 (eight) hours as needed for muscle spasms.    Note:  This document was prepared using Dragon voice recognition software and may include unintentional dictation errors.  Alona Bene, MD, [facility]    Carlin Mamone, Arlyss Repress, MD 01/18/20 [phone]

## 2020-01-18 NOTE — ED Triage Notes (Signed)
Pt reports has scoliosis and has back spasms intermittently.  Reports back started hurting after sitting in class all day yesterday.  Reports radiates into her hips.  Denies any problems with bowels or bladder.

## 2020-01-21 MED FILL — ALYACEN 1-35-28 TABLET: 1-35 | 84 days supply | Qty: 84 | Fill #3

## 2020-04-18 ENCOUNTER — Other Ambulatory Visit (HOSPITAL_COMMUNITY): Payer: Self-pay | Admitting: Nurse Practitioner

## 2020-04-18 DIAGNOSIS — Z3041 Encounter for surveillance of contraceptive pills: Secondary | ICD-10-CM | POA: Diagnosis not present

## 2020-04-18 DIAGNOSIS — Z800 Family history of malignant neoplasm of digestive organs: Secondary | ICD-10-CM | POA: Diagnosis not present

## 2020-04-18 DIAGNOSIS — Z01419 Encounter for gynecological examination (general) (routine) without abnormal findings: Secondary | ICD-10-CM | POA: Diagnosis not present

## 2020-04-18 DIAGNOSIS — K580 Irritable bowel syndrome with diarrhea: Secondary | ICD-10-CM | POA: Diagnosis not present

## 2020-04-18 MED FILL — ALYACEN 1-35-28 TABLET: 1-35 | 84 days supply | Qty: 84 | Fill #0

## 2020-04-18 MED FILL — OSCIMIN 0.125 MG TABLET: 0.125 | 8 days supply | Qty: 30 | Fill #0

## 2020-04-28 DIAGNOSIS — Z800 Family history of malignant neoplasm of digestive organs: Secondary | ICD-10-CM | POA: Diagnosis not present

## 2020-05-06 ENCOUNTER — Ambulatory Visit: Payer: 59 | Admitting: Family Medicine

## 2020-05-23 ENCOUNTER — Telehealth: Payer: Self-pay | Admitting: Nurse Practitioner

## 2020-05-23 NOTE — Telephone Encounter (Signed)
Left message for patient to get back with [place] about wanting to schedule a new patient appointment.  Patient is already in [facility].  She is a patient at [facility]  In [place]

## 2020-06-02 ENCOUNTER — Other Ambulatory Visit: Payer: Self-pay

## 2020-06-02 ENCOUNTER — Encounter: Payer: Self-pay | Admitting: Family Medicine

## 2020-06-02 ENCOUNTER — Ambulatory Visit: Payer: 59 | Admitting: Family Medicine

## 2020-06-02 VITALS — BP 130/82 | HR 102 | Temp 97.1°F | Ht 65.0 in | Wt 260.5 lb

## 2020-06-02 DIAGNOSIS — M2550 Pain in unspecified joint: Secondary | ICD-10-CM

## 2020-06-02 DIAGNOSIS — R5383 Other fatigue: Secondary | ICD-10-CM | POA: Diagnosis not present

## 2020-06-02 DIAGNOSIS — R531 Weakness: Secondary | ICD-10-CM

## 2020-06-02 DIAGNOSIS — F331 Major depressive disorder, recurrent, moderate: Secondary | ICD-10-CM | POA: Diagnosis not present

## 2020-06-02 NOTE — Patient Instructions (Signed)

## 2020-06-02 NOTE — Progress Notes (Signed)
Established Patient Office Visit  Subjective:  Patient ID: Stacey Gonzalez, female    DOB: December 07, 1995  Age: 25 y.o. MRN: [id number]  CC:  Chief Complaint  Patient presents with  . Fatigue    HPI Stacey Gonzalez presents for fatigue.   She reports fatigue with general aches and weakness for the last 6 months. She reports that this comes and goes. It occurs a few times a months and lasts for a few days. She reports that she feels like she has the worse flu and can only lay in the bed when it happens. It comes on suddenly. She will slowly start to feels better after a few days. She reports weakness, where she doesn't have any strength when this happens. The muscle and joint aches will be all over. She denies any congestion when this happens. She feels like this has gotten worse over the last 6 months. She denies chest pain, shortness of breath, or blurred vision when this happens. Denies focal weakness, numbness, or trouble swallowing. She is currently in a master's program that she started almost a year ago. She has been under more stress lately. She denies current symptoms of depression. She denies anxiety.  Depression screen [facility] 2/9 06/02/2020 06/02/2020 02/26/2019  Decreased Interest 0 0 0  Down, Depressed, Hopeless 0 0 0  PHQ - 2 Score 0 0 0  Altered sleeping 1 - -  Tired, decreased energy 1 - -  Change in appetite 0 - -  Feeling bad or failure about yourself  0 - -  Trouble concentrating 1 - -  Moving slowly or fidgety/restless 0 - -  Suicidal thoughts 0 - -  PHQ-9 Score 3 - -     Past Medical History:  Diagnosis Date  . Scoliosis     No past surgical history on file.  Family History  Problem Relation Age of Onset  . Cancer Mother        colorectal     Social History   Socioeconomic History  . Marital status: Single    Spouse name: Not on file  . Number of children: Not on file  . Years of education: Not on file  . Highest education level: Not on file  Occupational  History  . Not on file  Tobacco Use  . Smoking status: Never Smoker  . Smokeless tobacco: Never Used  Vaping Use  . Vaping Use: Never used  Substance and Sexual Activity  . Alcohol use: No  . Drug use: No  . Sexual activity: Never  Other Topics Concern  . Not on file  Social History Narrative  . Not on file   Social Determinants of Health   Financial Resource Strain: Not on file  Food Insecurity: Not on file  Transportation Needs: Not on file  Physical Activity: Not on file  Stress: Not on file  Social Connections: Not on file  Intimate Partner Violence: Not on file    Outpatient Medications Prior to Visit  Medication Sig Dispense Refill  . hyoscyamine (LEVSIN) 0.125 MG tablet Take 1 tablet (0.125 mg total) by mouth every 4 (four) hours as needed. 30 tablet 1  . norethindrone-ethinyl estradiol (CYCLAFEM) 0.5/0.75/1-35 MG-MCG tablet Take 1 tablet by mouth daily.    . cephALEXin (KEFLEX) 500 MG capsule Take 1 capsule (500 mg total) by mouth 3 (three) times daily. 21 capsule 0  . diclofenac Sodium (VOLTAREN) 1 % GEL Apply 2 g topically 4 (four) times daily as needed. [place]  g 0  . JUNEL FE 1.5/30 1.5-30 MG-MCG tablet TAKE 1 TABLET BY MOUTH EVERY DAY 28 tablet 13  . methocarbamol (ROBAXIN) 500 MG tablet Take 1 tablet (500 mg total) by mouth every 8 (eight) hours as needed for muscle spasms. 20 tablet 0  . sertraline (ZOLOFT) 100 MG tablet Take 1 tablet (100 mg total) by mouth daily. 90 tablet 0  . traZODone (DESYREL) 50 MG tablet 25-50 mg at night as needed for sleep 30 tablet 0   No facility-administered medications prior to visit.    Allergies  Allergen Reactions  . Kiwi Extract Anaphylaxis  . Lavender Oil Other (See Comments)    Hives (if far away), migraine nausea eye swelling (if close)  . Latex Rash  . Zithromax [Azithromycin] Rash    ROS Review of Systems Negative unless specially indicated above in HPI.    Objective:    Physical Exam Vitals and nursing note  reviewed.  Constitutional:      General: She is not in acute distress.    Appearance: She is well-developed. She is not ill-appearing, toxic-appearing or diaphoretic.  HENT:     Head: Normocephalic and atraumatic.     Nose: Nose normal.  Eyes:     Extraocular Movements: Extraocular movements intact.     Conjunctiva/sclera: Conjunctivae normal.     Pupils: Pupils are equal, round, and reactive to light.  Neck:     Thyroid: No thyromegaly.     Vascular: No carotid bruit or JVD.     Trachea: Trachea normal.  Cardiovascular:     Rate and Rhythm: Normal rate and regular rhythm.     Heart sounds: Normal heart sounds. No murmur heard. No friction rub. No gallop.   Pulmonary:     Effort: Pulmonary effort is normal.     Breath sounds: Normal breath sounds.  Abdominal:     General: Bowel sounds are normal. There is no distension.     Palpations: Abdomen is soft. There is no mass.     Tenderness: There is no abdominal tenderness.  Musculoskeletal:        General: Normal range of motion.     Cervical back: Full passive range of motion without pain, normal range of motion and neck supple.     Right lower leg: No edema.     Left lower leg: No edema.  Lymphadenopathy:     Cervical: No cervical adenopathy.  Skin:    General: Skin is warm and dry.  Neurological:     General: No focal deficit present.     Mental Status: She is alert and oriented to person, place, and time.     Sensory: No sensory deficit.     Motor: No weakness.     Coordination: Coordination normal.     Gait: Gait normal.     Deep Tendon Reflexes: Reflexes are normal and symmetric.  Psychiatric:        Mood and Affect: Mood normal.        Behavior: Behavior normal.        Thought Content: Thought content normal.        Judgment: Judgment normal.     BP 130/82   Pulse (!) 102   Temp (!) 97.1 F (36.2 C) (Temporal)   Ht 5' 5"  (1.651 m)   Wt 260 lb 8 oz (118.2 kg)   BMI 43.35 kg/m  Wt Readings from Last 3  Encounters:  06/02/20 260 lb 8 oz (118.2 kg)  01/18/20 259  lb (117.5 kg)  02/26/19 263 lb (119.3 kg)     Health Maintenance Due  Topic Date Due  . Hepatitis C Screening  Never done  . HIV Screening  Never done  . PAP-Cervical Cytology Screening  Never done  . PAP SMEAR-Modifier  Never done  . TETANUS/TDAP  01/18/2018  . COVID-19 Vaccine (3 - Booster for Moderna series) 12/13/2019    There are no preventive care reminders to display for this patient.  Lab Results  Component Value Date   TSH 1.550 12/28/2015   Lab Results  Component Value Date   WBC CANCELED 06/20/2017   WBC 6.6 06/20/2017   HGB CANCELED 06/20/2017   HGB 14.1 06/20/2017   HCT CANCELED 06/20/2017   HCT 41.3 06/20/2017   MCV 82 06/20/2017   PLT CANCELED 06/20/2017   PLT 217 06/20/2017   Lab Results  Component Value Date   NA CANCELED 06/20/2017   NA 135 06/20/2017   K CANCELED 06/20/2017   K 3.8 06/20/2017   CO2 CANCELED 06/20/2017   CO2 23 06/20/2017   GLUCOSE CANCELED 06/20/2017   GLUCOSE 100 (H) 06/20/2017   BUN CANCELED 06/20/2017   BUN 12 06/20/2017   CREATININE CANCELED 06/20/2017   CREATININE 0.93 06/20/2017   BILITOT CANCELED 06/20/2017   BILITOT 0.4 06/20/2017   ALKPHOS CANCELED 06/20/2017   ALKPHOS 83 06/20/2017   AST CANCELED 06/20/2017   AST 15 06/20/2017   ALT CANCELED 06/20/2017   ALT 14 06/20/2017   PROT CANCELED 06/20/2017   PROT 7.4 06/20/2017   ALBUMIN CANCELED 06/20/2017   ALBUMIN 4.1 06/20/2017   CALCIUM CANCELED 06/20/2017   CALCIUM 8.7 06/20/2017   ANIONGAP 9 03/25/2017     Assessment & Plan:   Kailah was seen today for fatigue.  Diagnoses and all orders for this visit:  Fatigue, unspecified type Labs pending as below. Exam unremarkable. Return to office for new or worsening symptoms, or if symptoms persist.  -     CBC with Differential/Platelet -     CMP14+EGFR -     Thyroid Panel With TSH -     VITAMIN D 25 Hydroxy (Vit-D Deficiency, Fractures) -      Vitamin B12 -     Lupus anticoagulant panel  Generalized weakness/Multiple joint pain Discussed possible autoimmune vs neurological cause. Labs pending as below. Exam unremarkable. Return to office for new or worsening symptoms, or if symptoms persist.  -     Arthritis Panel -     C-reactive protein -     CK total and CKMB (cardiac)not at [facility] -     Lupus anticoagulant panel   MDD (major depressive disorder), recurrent episode, moderate ([place]) Controlled. PHQ score of 3. Not currently on medication.    Follow-up: Return if symptoms worsen or fail to improve.   The patient indicates understanding of these issues and agrees with the plan.  Gwenlyn Perking, FNP

## 2020-06-03 ENCOUNTER — Other Ambulatory Visit: Payer: Self-pay | Admitting: Family Medicine

## 2020-06-03 DIAGNOSIS — E559 Vitamin D deficiency, unspecified: Secondary | ICD-10-CM

## 2020-06-03 LAB — CMP14+EGFR
ALT: 11 IU/L (ref 0–32)
AST: 18 IU/L (ref 0–40)
Albumin/Globulin Ratio: 1.4 (ref 1.2–2.2)
Albumin: 4.1 g/dL (ref 3.9–5.0)
Alkaline Phosphatase: 76 IU/L (ref 44–121)
BUN/Creatinine Ratio: 12 (ref 9–23)
BUN: 10 mg/dL (ref 6–20)
Bilirubin Total: 0.4 mg/dL (ref 0.0–1.2)
CO2: 17 mmol/L — ABNORMAL LOW (ref 20–29)
Calcium: 9.3 mg/dL (ref 8.7–10.2)
Chloride: 106 mmol/L (ref 96–106)
Creatinine, Ser: 0.85 mg/dL (ref 0.57–1.00)
GFR calc Af Amer: 109 mL/min/{1.73_m2} (ref >59)
GFR calc non Af Amer: 95 mL/min/{1.73_m2} (ref >59)
Globulin, Total: 2.9 g/dL (ref 1.5–4.5)
Glucose: 78 mg/dL (ref 65–99)
Potassium: 4.2 mmol/L (ref 3.5–5.2)
Sodium: 138 mmol/L (ref 134–144)
Total Protein: 7 g/dL (ref 6.0–8.5)

## 2020-06-03 LAB — C-REACTIVE PROTEIN: CRP: 17 mg/L — ABNORMAL HIGH (ref 0–10)

## 2020-06-03 LAB — ARTHRITIS PANEL
Basophils Absolute: 0 10*3/uL (ref 0.0–0.2)
Basos: 0 %
EOS (ABSOLUTE): 0.2 10*3/uL (ref 0.0–0.4)
Eos: 3 %
Hematocrit: 40.1 % (ref 34.0–46.6)
Hemoglobin: 13.3 g/dL (ref 11.1–15.9)
Immature Grans (Abs): 0 10*3/uL (ref 0.0–0.1)
Immature Granulocytes: 0 %
Lymphocytes Absolute: 1.8 10*3/uL (ref 0.7–3.1)
Lymphs: 23 %
MCH: 28.7 pg (ref 26.6–33.0)
MCHC: 33.2 g/dL (ref 31.5–35.7)
MCV: 86 fL (ref 79–97)
Monocytes Absolute: 0.5 10*3/uL (ref 0.1–0.9)
Monocytes: 6 %
Neutrophils Absolute: 5.2 10*3/uL (ref 1.4–7.0)
Neutrophils: 68 %
Platelets: 183 10*3/uL (ref 150–450)
RBC: 4.64 x10E6/uL (ref 3.77–5.28)
RDW: 13.6 % (ref 11.7–15.4)
Rhuematoid fact SerPl-aCnc: <10 IU/mL (ref <14.0)
Sed Rate: 35 mm/hr — ABNORMAL HIGH (ref 0–32)
Uric Acid: 4.8 mg/dL (ref 2.6–6.2)
WBC: 7.7 10*3/uL (ref 3.4–10.8)

## 2020-06-03 LAB — VITAMIN D 25 HYDROXY (VIT D DEFICIENCY, FRACTURES): Vit D, 25-Hydroxy: 18.6 ng/mL — ABNORMAL LOW (ref 30.0–100.0)

## 2020-06-03 LAB — THYROID PANEL WITH TSH
Free Thyroxine Index: 1.8 (ref 1.2–4.9)
T3 Uptake Ratio: 21 % — ABNORMAL LOW (ref 24–39)
T4, Total: 8.4 ug/dL (ref 4.5–12.0)
TSH: 1.54 u[IU]/mL (ref 0.450–4.500)

## 2020-06-03 LAB — VITAMIN B12: Vitamin B-12: 594 pg/mL (ref 232–1245)

## 2020-06-03 LAB — CK TOTAL AND CKMB (NOT AT ARMC)
CK-MB Index: <1 ng/mL (ref 0.0–5.3)
Total CK: 87 U/L (ref 32–182)

## 2020-06-03 MED ORDER — VITAMIN D (ERGOCALCIFEROL) 1.25 MG (50000 UNIT) PO CAPS: 50000.0000 [IU] | ORAL_CAPSULE | ORAL | 0 refills | Status: DC

## 2020-06-04 LAB — LUPUS ANTICOAGULANT PANEL
Dilute Viper Venom Time: 41.7 s (ref 0.0–47.0)
PTT Lupus Anticoagulant: 30.4 s (ref 0.0–51.9)

## 2020-06-24 ENCOUNTER — Other Ambulatory Visit: Payer: Self-pay | Admitting: Family Medicine

## 2020-06-24 DIAGNOSIS — E559 Vitamin D deficiency, unspecified: Secondary | ICD-10-CM

## 2020-07-08 MED FILL — ALYACEN 1-35-28 TABLET: 1-35 | 84 days supply | Qty: 84 | Fill #1

## 2020-09-30 ENCOUNTER — Other Ambulatory Visit (HOSPITAL_COMMUNITY): Payer: Self-pay

## 2020-09-30 MED FILL — Norethindrone & Ethinyl Estradiol Tab 1 MG-35 MCG: ORAL | 84 days supply | Qty: 84 | Fill #0 | Status: AC

## 2020-10-07 ENCOUNTER — Encounter: Payer: Self-pay | Admitting: Nurse Practitioner

## 2020-10-07 ENCOUNTER — Ambulatory Visit: Payer: 59 | Admitting: Nurse Practitioner

## 2020-10-07 ENCOUNTER — Other Ambulatory Visit: Payer: Self-pay

## 2020-10-07 VITALS — BP 124/84 | HR 106 | Temp 97.0°F | Ht 65.0 in | Wt 258.0 lb

## 2020-10-07 DIAGNOSIS — R5383 Other fatigue: Secondary | ICD-10-CM | POA: Diagnosis not present

## 2020-10-07 DIAGNOSIS — Z23 Encounter for immunization: Secondary | ICD-10-CM | POA: Diagnosis not present

## 2020-10-07 NOTE — Addendum Note (Signed)
Addended by: Dory Peru on: 10/07/2020 09:39 AM   Modules accepted: Orders

## 2020-10-07 NOTE — Progress Notes (Signed)
Acute Office Visit  Subjective:    Patient ID: Stacey Gonzalez, female    DOB: Sep 09, 1995, 25 y.o.   MRN: [id number]  Chief Complaint  Patient presents with  . Fatigue    HPI Patient is in today for  Fatigue  She reports recurrent fatigue which she describes as a lack of energy, feeling exhausted and feeling weak. It began a few years ago and occurs a few days a week. It is described as marked and staying constant. She has not started new medications around the time the fatigue started.   Associated symptoms: No arthralgias No bleeding  No melena No chest discomfort  No heart palpitations No heart racing   No dyspnea No feeling depressed  No feeling anxious or under stress No fevers  No loss of appetite No nausea  No vomiting Yes sleeping problems    Wt Readings from Last 3 Encounters:  10/07/20 258 lb (117 kg)  06/02/20 260 lb 8 oz (118.2 kg)  01/18/20 259 lb (117.5 kg)    Lab Results  Component Value Date   WBC 7.7 06/02/2020   HGB 13.3 06/02/2020   HCT 40.1 06/02/2020   MCV 86 06/02/2020   PLT 183 06/02/2020   Lab Results  Component Value Date   TSH 1.540 06/02/2020   Lab Results  Component Value Date   NA 138 06/02/2020   K 4.2 06/02/2020   CO2 17 (L) 06/02/2020   BUN 10 06/02/2020   CREATININE 0.85 06/02/2020   CALCIUM 9.3 06/02/2020   GLUCOSE 78 06/02/2020     ---------------------------------------------------------------------------------------------------  Past Medical History:  Diagnosis Date  . Scoliosis     History reviewed. No pertinent surgical history.  Family History  Problem Relation Age of Onset  . Cancer Mother        colorectal     Social History   Socioeconomic History  . Marital status: Single    Spouse name: Not on file  . Number of children: Not on file  . Years of education: Not on file  . Highest education level: Not on file  Occupational History  . Not on file  Tobacco Use  . Smoking status: Never Smoker  .  Smokeless tobacco: Never Used  Vaping Use  . Vaping Use: Never used  Substance and Sexual Activity  . Alcohol use: No  . Drug use: No  . Sexual activity: Never  Other Topics Concern  . Not on file  Social History Narrative  . Not on file   Social Determinants of Health   Financial Resource Strain: Not on file  Food Insecurity: Not on file  Transportation Needs: Not on file  Physical Activity: Not on file  Stress: Not on file  Social Connections: Not on file  Intimate Partner Violence: Not on file    Outpatient Medications Prior to Visit  Medication Sig Dispense Refill  . hyoscyamine (LEVSIN) 0.125 MG tablet Take 1 tablet (0.125 mg total) by mouth every 4 (four) hours as needed. 30 tablet 1  . norethindrone-ethinyl estradiol 1/35 (ORTHO-NOVUM) tablet TAKE 1 TABLET BY MOUTH DAILY. 84 tablet 3  . hyoscyamine (LEVSIN) 0.125 MG tablet TAKE 1 TABLET (0.125 MG TOTAL) BY MOUTH EVERY SIX (6) HOURS AS NEEDED FOR CRAMPING OR DIARRHEA. 30 tablet 1  . norethindrone-ethinyl estradiol (CYCLAFEM) 0.5/0.75/1-35 MG-MCG tablet Take 1 tablet by mouth daily.    . Vitamin D, Ergocalciferol, (DRISDOL) 1.25 MG (50000 UNIT) CAPS capsule TAKE 1 CAPSULE (50,000 UNITS TOTAL) BY MOUTH EVERY  7 (SEVEN) DAYS 4 capsule 1   No facility-administered medications prior to visit.    Allergies  Allergen Reactions  . Kiwi Extract Anaphylaxis  . Lavender Oil Other (See Comments)    Hives (if far away), migraine nausea eye swelling (if close)  . Latex Rash  . Zithromax [Azithromycin] Rash    Review of Systems  Constitutional: Negative.   Respiratory: Negative.   Cardiovascular: Negative.   Gastrointestinal: Negative.   Genitourinary: Negative.   Psychiatric/Behavioral: Positive for sleep disturbance. The patient is not nervous/anxious.   All other systems reviewed and are negative.      Objective:    Physical Exam Vitals and nursing note reviewed.  Constitutional:      Appearance: She is obese.   HENT:     Head: Normocephalic.     Nose: Nose normal.  Eyes:     Conjunctiva/sclera: Conjunctivae normal.  Cardiovascular:     Rate and Rhythm: Tachycardia present.     Pulses: Normal pulses.     Heart sounds: Normal heart sounds.  Pulmonary:     Effort: Pulmonary effort is normal.     Breath sounds: Normal breath sounds.  Abdominal:     General: Bowel sounds are normal.  Skin:    Findings: No erythema or rash.  Neurological:     Mental Status: She is alert and oriented to person, place, and time.  Psychiatric:        Behavior: Behavior normal.     BP 124/84   Pulse (!) 106   Temp (!) 97 F (36.1 C) (Temporal)   Ht 5\' 5"  (1.651 m)   Wt 258 lb (117 kg)   SpO2 99%   BMI 42.93 kg/m  Wt Readings from Last 3 Encounters:  10/07/20 258 lb (117 kg)  06/02/20 260 lb 8 oz (118.2 kg)  01/18/20 259 lb (117.5 kg)    Health Maintenance Due  Topic Date Due  . HPV VACCINES (3 - 2-dose series) 12/02/2007  . HIV Screening  Never done  . Hepatitis C Screening  Never done  . PAP-Cervical Cytology Screening  Never done  . PAP SMEAR-Modifier  Never done  . TETANUS/TDAP  01/18/2018  . COVID-19 Vaccine (3 - Booster for Moderna series) 11/13/2019       Topic Date Due  . HPV VACCINES (3 - 2-dose series) 12/02/2007     Lab Results  Component Value Date   TSH 1.540 06/02/2020   Lab Results  Component Value Date   WBC 7.7 06/02/2020   HGB 13.3 06/02/2020   HCT 40.1 06/02/2020   MCV 86 06/02/2020   PLT 183 06/02/2020   Lab Results  Component Value Date   NA 138 06/02/2020   K 4.2 06/02/2020   CO2 17 (L) 06/02/2020   GLUCOSE 78 06/02/2020   BUN 10 06/02/2020   CREATININE 0.85 06/02/2020   BILITOT 0.4 06/02/2020   ALKPHOS 76 06/02/2020   AST 18 06/02/2020   ALT 11 06/02/2020   PROT 7.0 06/02/2020   ALBUMIN 4.1 06/02/2020   CALCIUM 9.3 06/02/2020   ANIONGAP 9 03/25/2017   No results found for: CHOL No results found for: HDL No results found for: LDLCALC No  results found for: TRIG No results found for: CHOLHDL No results found for: 13/09/2016     Assessment & Plan:   Problem List Items Addressed This Visit      Other   Fatigue - Primary    Patient is reporting worsening fatigue in the  last year.  Treated with vitamin D and reports mild relief.  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  Patient is not depressed, PHQ -9 is a 3.  Patient is reporting headaches occasionally with flashes of light and visual fields but this is not new.  She does not have a history of migraine.  Labs completed-B12, CMP, CBC, TSH.  Results pending.  Education provided to patient with printed handouts given.  We will follow-up pending lab results.       Relevant Orders   Vitamin B12   CBC with Differential   Comprehensive metabolic panel   TSH   Vitamin D, 25-hydroxy       No orders of the defined types were placed in this encounter.    Daryll Drown, NP

## 2020-10-07 NOTE — Patient Instructions (Signed)

## 2020-10-07 NOTE — Assessment & Plan Note (Signed)
Patient is reporting worsening fatigue in the last year.  Treated with vitamin D and reports mild relief.  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  Patient is not depressed, PHQ -9 is a 3.  Patient is reporting headaches occasionally with flashes of light and visual fields but this is not new.  She does not have a history of migraine.  Labs completed-B12, CMP, CBC, TSH.  Results pending.  Education provided to patient with printed handouts given.  We will follow-up pending lab results.

## 2020-10-08 LAB — TSH: TSH: 1.81 u[IU]/mL (ref 0.450–4.500)

## 2020-10-08 LAB — CBC WITH DIFFERENTIAL/PLATELET
Basophils Absolute: 0 10*3/uL (ref 0.0–0.2)
Basos: 1 %
EOS (ABSOLUTE): 0.2 10*3/uL (ref 0.0–0.4)
Eos: 2 %
Hematocrit: 40.5 % (ref 34.0–46.6)
Hemoglobin: 13.4 g/dL (ref 11.1–15.9)
Immature Grans (Abs): 0 10*3/uL (ref 0.0–0.1)
Immature Granulocytes: 0 %
Lymphocytes Absolute: 2.2 10*3/uL (ref 0.7–3.1)
Lymphs: 26 %
MCH: 29 pg (ref 26.6–33.0)
MCHC: 33.1 g/dL (ref 31.5–35.7)
MCV: 88 fL (ref 79–97)
Monocytes Absolute: 0.4 10*3/uL (ref 0.1–0.9)
Monocytes: 5 %
Neutrophils Absolute: 5.5 10*3/uL (ref 1.4–7.0)
Neutrophils: 66 %
Platelets: 210 10*3/uL (ref 150–450)
RBC: 4.62 x10E6/uL (ref 3.77–5.28)
RDW: 12.8 % (ref 11.7–15.4)
WBC: 8.3 10*3/uL (ref 3.4–10.8)

## 2020-10-08 LAB — VITAMIN B12: Vitamin B-12: 411 pg/mL (ref 232–1245)

## 2020-10-08 LAB — VITAMIN D 25 HYDROXY (VIT D DEFICIENCY, FRACTURES): Vit D, 25-Hydroxy: 31.1 ng/mL (ref 30.0–100.0)

## 2020-12-23 ENCOUNTER — Other Ambulatory Visit (HOSPITAL_COMMUNITY): Payer: Self-pay

## 2020-12-23 MED FILL — Norethindrone & Ethinyl Estradiol Tab 1 MG-35 MCG: ORAL | 84 days supply | Qty: 84 | Fill #1 | Status: AC

## 2021-01-13 ENCOUNTER — Encounter: Payer: Self-pay | Admitting: Nurse Practitioner

## 2021-01-13 ENCOUNTER — Other Ambulatory Visit: Payer: Self-pay

## 2021-01-13 ENCOUNTER — Ambulatory Visit: Payer: 59 | Admitting: Nurse Practitioner

## 2021-01-13 VITALS — BP 122/83 | HR 99 | Temp 97.0°F | Ht 65.0 in | Wt 270.0 lb

## 2021-01-13 DIAGNOSIS — R5383 Other fatigue: Secondary | ICD-10-CM

## 2021-01-13 NOTE — Progress Notes (Signed)
Established Patient Office Visit  Subjective:  Patient ID: Stacey Gonzalez, female    DOB: Sep 13, 1995  Age: 25 y.o. MRN: [id number]  CC:  Chief Complaint  Patient presents with   Fatigue    HPI Stacey Gonzalez presents for Fatigue  She reports recurrent fatigue which she describes as a lack of energy, feeling sleepy, and feeling weak. It began a few months ago and occurs all the time and every day. It is described as marked and worsening. She has not started new medications around the time the fatigue started.   Associated symptoms: Yes arthralgias No bleeding  No melena No chest discomfort  No heart palpitations No heart racing   No dyspnea No feeling depressed  No feeling anxious or under stress No fevers  No loss of appetite No nausea  No vomiting Yes sleeping problems    Wt Readings from Last 3 Encounters:  01/13/21 270 lb (122.5 kg)  10/07/20 258 lb (117 kg)  06/02/20 260 lb 8 oz (118.2 kg)    Lab Results  Component Value Date   WBC 8.3 10/07/2020   HGB 13.4 10/07/2020   HCT 40.5 10/07/2020   MCV 88 10/07/2020   PLT 210 10/07/2020   Lab Results  Component Value Date   TSH 1.810 10/07/2020   Lab Results  Component Value Date   NA 138 06/02/2020   K 4.2 06/02/2020   CO2 17 (L) 06/02/2020   BUN 10 06/02/2020   CREATININE 0.85 06/02/2020   CALCIUM 9.3 06/02/2020   GLUCOSE 78 06/02/2020     ---------------------------------------------------------------------------------------------------   Past Medical History:  Diagnosis Date   Scoliosis     History reviewed. No pertinent surgical history.  Family History  Problem Relation Age of Onset   Cancer Mother        colorectal    Kidney disease Daughter     Social History   Socioeconomic History   Marital status: Single    Spouse name: Not on file   Number of children: Not on file   Years of education: Not on file   Highest education level: Not on file  Occupational History   Not on file  Tobacco  Use   Smoking status: Never   Smokeless tobacco: Never  Vaping Use   Vaping Use: Never used  Substance and Sexual Activity   Alcohol use: No   Drug use: Yes    Types: Barbituates   Sexual activity: Not Currently  Other Topics Concern   Not on file  Social History Narrative   Not on file   Social Determinants of Health   Financial Resource Strain: Not on file  Food Insecurity: Not on file  Transportation Needs: Not on file  Physical Activity: Not on file  Stress: Not on file  Social Connections: Not on file  Intimate Partner Violence: Not on file    Outpatient Medications Prior to Visit  Medication Sig Dispense Refill   hyoscyamine (LEVSIN) 0.125 MG tablet Take 1 tablet (0.125 mg total) by mouth every 4 (four) hours as needed. 30 tablet 1   norethindrone-ethinyl estradiol 1/35 (ORTHO-NOVUM) tablet TAKE 1 TABLET BY MOUTH DAILY. 84 tablet 3   No facility-administered medications prior to visit.    Allergies  Allergen Reactions   Kiwi Extract Anaphylaxis   Lavender Oil Other (See Comments)    Hives (if far away), migraine nausea eye swelling (if close)   Latex Rash   Zithromax [Azithromycin] Rash    ROS Review  of Systems  Constitutional: Negative.   HENT: Negative.    Eyes: Negative.   Respiratory: Negative.    Cardiovascular: Negative.   Genitourinary: Negative.   Neurological:  Negative for weakness, light-headedness and headaches.  All other systems reviewed and are negative.    Objective:    Physical Exam Vitals and nursing note reviewed.  Constitutional:      Appearance: Normal appearance. She is obese.  HENT:     Head: Normocephalic.     Right Ear: Ear canal normal.  Cardiovascular:     Rate and Rhythm: Normal rate and regular rhythm.     Pulses: Normal pulses.     Heart sounds: Normal heart sounds.  Pulmonary:     Effort: Pulmonary effort is normal.     Breath sounds: Normal breath sounds.  Abdominal:     General: Bowel sounds are normal.   Skin:    Findings: No rash.  Neurological:     Mental Status: She is alert and oriented to person, place, and time.  Psychiatric:        Behavior: Behavior normal.    BP 122/83   Pulse 99   Temp (!) 97 F (36.1 C) (Temporal)   Ht 5\' 5"  (1.651 m)   Wt 270 lb (122.5 kg)   LMP 12/24/2020   BMI 44.93 kg/m  Wt Readings from Last 3 Encounters:  01/13/21 270 lb (122.5 kg)  10/07/20 258 lb (117 kg)  06/02/20 260 lb 8 oz (118.2 kg)     Health Maintenance Due  Topic Date Due   HIV Screening  Never done   Hepatitis C Screening  Never done   PAP-Cervical Cytology Screening  Never done   PAP SMEAR-Modifier  Never done   COVID-19 Vaccine (3 - Booster for Moderna series) 11/13/2019   INFLUENZA VACCINE  12/19/2020    There are no preventive care reminders to display for this patient.  Lab Results  Component Value Date   TSH 1.810 10/07/2020   Lab Results  Component Value Date   WBC 8.3 10/07/2020   HGB 13.4 10/07/2020   HCT 40.5 10/07/2020   MCV 88 10/07/2020   PLT 210 10/07/2020   Lab Results  Component Value Date   NA 138 06/02/2020   K 4.2 06/02/2020   CO2 17 (L) 06/02/2020   GLUCOSE 78 06/02/2020   BUN 10 06/02/2020   CREATININE 0.85 06/02/2020   BILITOT 0.4 06/02/2020   ALKPHOS 76 06/02/2020   AST 18 06/02/2020   ALT 11 06/02/2020   PROT 7.0 06/02/2020   ALBUMIN 4.1 06/02/2020   CALCIUM 9.3 06/02/2020   ANIONGAP 9 03/25/2017   Flowsheet Row Office Visit from 10/07/2020 in 10/09/2020 Family [facility]  PHQ-9 Total Score 3         Assessment & Plan:   Problem List Items Addressed This Visit       Other   Fatigue - Primary    Patients symptoms are not improving, fatigue has no known cause, all labs came back negative, TSH, B12, CBC, CMP and lupus.  Completed sedimentation rate labs results pending.  Provided extensive education on weight loss diet and exercise.  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  We  talked about portion control, and setting a goal of losing at least 10% of total body weight.  Patient verbalized understanding.      Relevant Orders   Sedimentation Rate   Morbid obesity (HCC)  Patient continues to report increased fatigue, current BMI 44.93 kg /m, education provided to patient on weight loss, calorie counting and diet with exercise as tolerated.  We will follow-up in 3          Follow-up: Return if symptoms worsen or fail to improve.    Daryll Drown, NP

## 2021-01-13 NOTE — Assessment & Plan Note (Signed)
Patients symptoms are not improving, fatigue has no known cause, all labs came back negative, TSH, B12, CBC, CMP and lupus.  Completed sedimentation rate labs results pending.  Provided extensive education on weight loss diet and exercise.  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  We talked about portion control, and setting a goal of losing at least 10% of total body weight.  Patient verbalized understanding.

## 2021-01-13 NOTE — Patient Instructions (Signed)

## 2021-01-13 NOTE — Assessment & Plan Note (Signed)
Patient continues to report increased fatigue, current BMI 44.93 kg /m, education provided to patient on weight loss, calorie counting and diet with exercise as tolerated.  We will follow-up in 3

## 2021-01-14 LAB — SEDIMENTATION RATE: Sed Rate: 31 mm/hr (ref 0–32)

## 2021-02-14 ENCOUNTER — Encounter: Payer: Self-pay | Admitting: Nurse Practitioner

## 2021-02-14 ENCOUNTER — Telehealth: Payer: 59 | Admitting: Nurse Practitioner

## 2021-02-14 DIAGNOSIS — M62830 Muscle spasm of back: Secondary | ICD-10-CM | POA: Diagnosis not present

## 2021-02-14 MED ORDER — CYCLOBENZAPRINE HCL 10 MG PO TABS
10.0000 mg | ORAL_TABLET | Freq: Three times a day (TID) | ORAL | 0 refills | Status: AC | PRN
Start: 2021-02-14 — End: ?

## 2021-02-14 NOTE — Progress Notes (Signed)
Virtual Visit Consent   Stacey Gonzalez, you are scheduled for a virtual visit with a [facility] provider today.     Just as with appointments in the office, your consent must be obtained to participate.  Your consent will be active for this visit and any virtual visit you may have with one of our providers in the next 365 days.     If you have a MyChart account, a copy of this consent can be sent to you electronically.  All virtual visits are billed to your insurance company just like a traditional visit in the office.    As this is a virtual visit, video technology does not allow for your provider to perform a traditional examination.  This may limit your provider's ability to fully assess your condition.  If your provider identifies any concerns that need to be evaluated in person or the need to arrange testing (such as labs, EKG, etc.), we will make arrangements to do so.     Although advances in technology are sophisticated, we cannot ensure that it will always work on either your end or our end.  If the connection with a video visit is poor, the visit may have to be switched to a telephone visit.  With either a video or telephone visit, we are not always able to ensure that we have a secure connection.     I need to obtain your verbal consent now.   Are you willing to proceed with your visit today?    Stacey Gonzalez has provided verbal consent on 02/14/2021 for a virtual visit (video or telephone).   Viviano Simas, FNP   Date: 02/14/2021 1:30 PM   Virtual Visit via Video Note   I, Viviano Simas, connected with  Stacey Gonzalez  ([id number], 1995-10-19) on 02/14/21 at  1:45 PM EDT by a video-enabled telemedicine application and verified that I am speaking with the correct person using two identifiers.  Location: Patient: Virtual Visit Location Patient: Home Provider: Virtual Visit Location Provider: Home Office   I discussed the limitations of evaluation and management by telemedicine and the  availability of in person appointments. The patient expressed understanding and agreed to proceed.    History of Present Illness: Stacey Gonzalez is a 25 y.o. who identifies as a female who was assigned female at birth, and is being seen today with complaints of back spasms. This onset yesterday while at work. 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 Pain did not onset instantly but just after that when bending down to get something from the refrigerator and her back went into spasm.   She has had back pain intermittently in the past this started when she was 25 years old.  She has needed muscle relaxers in the past and tylenol for relief.  Denies a history of steroid use or injections.  She does know she has a history of scoliosis but no other formal diagnosis.   She is only comfortable when leaning forward. It is painful to straighten her back.  She has some tingling in her right leg denies pain or numbness or weakness in her legs.   Denies any difficulty with urination or BM Problems:  Patient Active Problem List   Diagnosis Date Noted   Morbid obesity (HCC) 01/13/2021   Fatigue 10/07/2020   Vitamin D deficiency 06/03/2020   Family history of colon cancer in mother 04/15/2019   Pain in right knee 12/12/2017   Dislocation  of right knee 12/11/2017   Urticaria 11/11/2017   Acne vulgaris 04/08/2017   MDD (major depressive disorder), recurrent episode, moderate (HCC) 02/20/2017   Patellar dislocation 01/25/2015    Allergies:  Allergies  Allergen Reactions   Kiwi Extract Anaphylaxis   Lavender Oil Other (See Comments)    Hives (if far away), migraine nausea eye swelling (if close)   Latex Rash   Zithromax [Azithromycin] Rash   Medications:  Current Outpatient Medications:    hyoscyamine (LEVSIN) 0.125 MG tablet, Take 1 tablet (0.125 mg total) by mouth every 4 (four) hours as needed., Disp: 30 tablet, Rfl: 1   norethindrone-ethinyl estradiol 1/35 (ORTHO-NOVUM) tablet, TAKE 1  TABLET BY MOUTH DAILY., Disp: 84 tablet, Rfl: 3  Observations/Objective: Patient is well-developed, well-nourished in no acute distress.  Resting comfortably at home.  Head is normocephalic, atraumatic.  No labored breathing.  Speech is clear and coherent with logical content.  Patient is alert and oriented at baseline.    Assessment and Plan: 1. Muscle spasm of back Meds ordered this encounter  Medications   cyclobenzaprine (FLEXERIL) 10 MG tablet    Sig: Take 1 tablet (10 mg total) by mouth 3 (three) times daily as needed for muscle spasms.    Dispense:  30 tablet    Refill:  0      Follow Up Instructions: I discussed the assessment and treatment plan with the patient. The patient was provided an opportunity to ask questions and all were answered. The patient agreed with the plan and demonstrated an understanding of the instructions.  A copy of instructions were sent to the patient via MyChart unless otherwise noted below.    The patient was advised to call back or seek an in-person evaluation if the symptoms worsen or if the condition fails to improve as anticipated.  Time:  I spent 15 minutes with the patient via telehealth technology discussing the above problems/concerns.    Viviano Simas, FNP

## 2021-02-16 DIAGNOSIS — H5213 Myopia, bilateral: Secondary | ICD-10-CM | POA: Diagnosis not present

## 2021-03-02 DIAGNOSIS — M898X3 Other specified disorders of bone, forearm: Secondary | ICD-10-CM | POA: Diagnosis not present

## 2021-03-02 DIAGNOSIS — M47816 Spondylosis without myelopathy or radiculopathy, lumbar region: Secondary | ICD-10-CM | POA: Diagnosis not present

## 2021-03-02 DIAGNOSIS — M4316 Spondylolisthesis, lumbar region: Secondary | ICD-10-CM | POA: Diagnosis not present

## 2021-03-02 DIAGNOSIS — M545 Low back pain, unspecified: Secondary | ICD-10-CM | POA: Diagnosis not present

## 2021-03-02 DIAGNOSIS — M898X8 Other specified disorders of bone, other site: Secondary | ICD-10-CM | POA: Diagnosis not present

## 2021-03-02 DIAGNOSIS — M419 Scoliosis, unspecified: Secondary | ICD-10-CM | POA: Diagnosis not present

## 2021-03-02 DIAGNOSIS — G894 Chronic pain syndrome: Secondary | ICD-10-CM | POA: Diagnosis not present

## 2021-03-09 DIAGNOSIS — M419 Scoliosis, unspecified: Secondary | ICD-10-CM | POA: Diagnosis not present

## 2021-03-09 DIAGNOSIS — G894 Chronic pain syndrome: Secondary | ICD-10-CM | POA: Diagnosis not present

## 2021-03-09 DIAGNOSIS — M6281 Muscle weakness (generalized): Secondary | ICD-10-CM | POA: Diagnosis not present

## 2021-03-09 DIAGNOSIS — S3992XA Unspecified injury of lower back, initial encounter: Secondary | ICD-10-CM | POA: Diagnosis not present

## 2021-03-12 DIAGNOSIS — M25561 Pain in right knee: Secondary | ICD-10-CM | POA: Diagnosis not present

## 2021-03-12 DIAGNOSIS — M7651 Patellar tendinitis, right knee: Secondary | ICD-10-CM | POA: Diagnosis not present

## 2021-03-15 ENCOUNTER — Ambulatory Visit: Payer: 59 | Admitting: Orthopedic Surgery

## 2021-03-15 ENCOUNTER — Other Ambulatory Visit (HOSPITAL_COMMUNITY): Payer: Self-pay

## 2021-03-15 ENCOUNTER — Other Ambulatory Visit: Payer: Self-pay

## 2021-03-15 ENCOUNTER — Ambulatory Visit: Payer: 59

## 2021-03-15 ENCOUNTER — Encounter: Payer: Self-pay | Admitting: Orthopedic Surgery

## 2021-03-15 ENCOUNTER — Telehealth: Payer: Self-pay | Admitting: Orthopedic Surgery

## 2021-03-15 VITALS — BP 132/90 | HR 104 | Ht 65.0 in | Wt 272.0 lb

## 2021-03-15 DIAGNOSIS — S83004A Unspecified dislocation of right patella, initial encounter: Secondary | ICD-10-CM | POA: Diagnosis not present

## 2021-03-15 DIAGNOSIS — M25561 Pain in right knee: Secondary | ICD-10-CM

## 2021-03-15 NOTE — Patient Instructions (Signed)
Keep knee in immobilizer at all times for the next week.  Ok to remove for hygiene  Exercises attached, please start in a week  In a week, you can stop using the immobilizer and use a compression sleeve or similar, if you feel that you need it.  The exercises will be important to help stabilize the knee cap  Medications as needed  Out of work for a week, please provide a letter.

## 2021-03-15 NOTE — Progress Notes (Signed)
New Patient Visit  Assessment: Stacey Gonzalez is a 25 y.o. female with the following: 1. Dislocation of right patella, initial encounter  Plan: Patient's description, and presentation today are consistent with a right patella dislocation.  She did not require a formal reduction.  She continues to have pain and stiffness in the right knee.  She does have tenderness palpation along the medial patella.  There is lateral tilt evident on the sunrise view on x-ray.  This would be her second right patella dislocation, with the first being many years ago.  I would like for her to wear a knee immobilizer at all times for the next week.  After that, she can initiate some basic at home exercises.  In addition, we can transition her to a patellar stabilizing brace in 1 week.  Anticipate that she can recover from this injury, and do well, not needing surgery.  However, if she has any further issues, we can discuss surgical stabilization.  She stated her understanding.  Medications as needed.   Follow-up: Return in about 3 weeks (around 04/05/2021).  Subjective:  Chief Complaint  Patient presents with   Knee Pain    Pt states she was doing normal activity when she felt her kneecap pop out of place, states she pushed in back and went to the UC the next day. Still has some stiffiness but not much pain DOI 03/11/21    History of Present Illness: Stacey Gonzalez is a 25 y.o. female who presents for evaluation of right knee pain.  Within the last week, she states she was getting out of her bed, when she noted that her knee Had popped out of place.  She was able to get it back into position.  However, she did have ongoing pain.  She presented to an urgent care center, and x-rays were negative.  She has not been wearing a brace.  She has been using KT taping, but she does not was tolerate this well.  She is taking medications as needed.  She is also continue to work.   Review of Systems: No fevers or chills No  numbness or tingling No chest pain No shortness of breath No bowel or bladder dysfunction No GI distress No headaches   Medical History:  Past Medical History:  Diagnosis Date   Scoliosis     No past surgical history on file.  Family History  Problem Relation Age of Onset   Cancer Mother        colorectal    Kidney disease Daughter    Social History   Tobacco Use   Smoking status: Never   Smokeless tobacco: Never  Vaping Use   Vaping Use: Never used  Substance Use Topics   Alcohol use: No   Drug use: Yes    Types: Barbituates    Allergies  Allergen Reactions   Kiwi Extract Anaphylaxis   Lavender Oil Other (See Comments)    Hives (if far away), migraine nausea eye swelling (if close)   Latex Rash   Zithromax [Azithromycin] Rash    No outpatient medications have been marked as taking for the 03/15/21 encounter (Office Visit) with Oliver Barre, MD.    Objective: BP 132/90   Pulse (!) 104   Ht 5\' 5"  (1.651 m)   Wt 272 lb (123.4 kg)   LMP 02/17/2021   BMI 45.26 kg/m   Physical Exam:  General: Alert and oriented., No acute distress., and Obese female. Gait: Right sided antalgic  gait.  Evaluation of the knee is limited due to body habitus.  Patient has mild effusion.  No bruising is appreciated around the knee.  She is tenderness to palpation along the medial patella.  No apprehension with patellar translation.  Range of motion from 0-90, with worsening pain being on 90 degrees of flexion.  No increased laxity to varus or valgus stress.  IMAGING: I personally ordered and reviewed the following images  X-rays of the right knee were obtained in clinic today and demonstrates no acute injury.  Well-maintained joint spaces within the medial and lateral compartments.  Obvious lateral patellar tilt, without additional injuries within the patellofemoral joint.  Impression: Normal right knee x-rays, with the exception of lateral patellar tilt   New  Medications:  No orders of the defined types were placed in this encounter.     Oliver Barre, MD  03/15/2021 11:57 AM

## 2021-03-15 NOTE — Telephone Encounter (Signed)
Patient called to relay that she spoke with her director at work and that she will need to stay out of work. Said that they do not have light duty either. May she receive a note to be out of work until her next scheduled appointment 04/05/21?

## 2021-03-16 ENCOUNTER — Encounter: Payer: Self-pay | Admitting: Orthopedic Surgery

## 2021-03-16 ENCOUNTER — Other Ambulatory Visit (HOSPITAL_COMMUNITY): Payer: Self-pay

## 2021-03-16 MED ORDER — ALYACEN 1/35 1-35 MG-MCG PO TABS
1.0000 | ORAL_TABLET | Freq: Every day | ORAL | 1 refills | Status: DC
  Filled 2021-03-16: qty 56, 56d supply, fill #0

## 2021-03-17 NOTE — Telephone Encounter (Signed)
As of 03/16/21, work note updated and issued accordingly.

## 2021-03-22 ENCOUNTER — Telehealth: Payer: Self-pay | Admitting: Orthopedic Surgery

## 2021-03-22 NOTE — Telephone Encounter (Signed)
Call from patient regarding other forms (accomodation) which patient has received from employer. Discussed forms process, however, holding on [employer] fee as it appears form may need to be completed by Dr. Tawanna Cooler and placed in Dr's box.

## 2021-04-05 ENCOUNTER — Other Ambulatory Visit: Payer: Self-pay

## 2021-04-05 ENCOUNTER — Encounter: Payer: Self-pay | Admitting: Orthopedic Surgery

## 2021-04-05 ENCOUNTER — Ambulatory Visit: Payer: 59 | Admitting: Orthopedic Surgery

## 2021-04-05 VITALS — Ht 65.0 in | Wt 272.0 lb

## 2021-04-05 DIAGNOSIS — S83004D Unspecified dislocation of right patella, subsequent encounter: Secondary | ICD-10-CM

## 2021-04-05 NOTE — Progress Notes (Signed)
Orthopaedic Clinic Return  Assessment: Stacey Gonzalez is a 25 y.o. female with the following: Right patella dislocation   Plan: Patient is doing well following her injury.  She has no pain in the right knee.  She is comfortable with doing some exercises on her own.  She is not wearing a brace.  We discussed the pathology involved, and I stressed to her the importance of strengthening the right leg, to stabilize the patella.  She stated her understanding.  We do not he schedule follow-up appointment.  She will contact the clinic if she has any issues.  The patient meets the AMA guidelines for Morbid obesity with BMI > 40.  The patient has been counseled on weight loss.    Follow-up: Return if symptoms worsen or fail to improve.   Subjective:  Chief Complaint  Patient presents with   Knee Pain    Rt patella dislocations, pt states she's been feeling better since injury.     History of Present Illness: Stacey Gonzalez is a 25 y.o. female who returns to clinic for repeat evaluation of her right knee.  Approximately 3 weeks ago, she sustained a right patella dislocation.  When we last saw her, we recommended a knee immobilizer for short period of time, followed by gentle return of range of motion.  She states that the pain is significantly improved.  She is able to walk without assistive devices.  She is not wearing a brace.  She has been doing some exercises on her own.  She is pleased with her progress today.  Review of Systems: No fevers or chills No numbness or tingling No chest pain No shortness of breath No bowel or bladder dysfunction No GI distress No headaches   Objective: Ht 5\' 5"  (1.651 m)   Wt 272 lb (123.4 kg)   BMI 45.26 kg/m   Physical Exam:  Alert and oriented.  No acute distress.  Evaluation of the right knee demonstrates no swelling.  No bruising is appreciated.  Mild tenderness to palpation along the medial joint line.  She has full range of motion.  Sensation  is intact distally.  IMAGING: I personally ordered and reviewed the following images:  No new imaging obtained today.  , MD 04/05/2021 9:49 AM

## 2021-04-19 DIAGNOSIS — Z3041 Encounter for surveillance of contraceptive pills: Secondary | ICD-10-CM | POA: Diagnosis not present

## 2021-04-19 DIAGNOSIS — Z800 Family history of malignant neoplasm of digestive organs: Secondary | ICD-10-CM | POA: Diagnosis not present

## 2021-04-19 DIAGNOSIS — Z01419 Encounter for gynecological examination (general) (routine) without abnormal findings: Secondary | ICD-10-CM | POA: Diagnosis not present

## 2021-04-20 ENCOUNTER — Other Ambulatory Visit (HOSPITAL_COMMUNITY): Payer: Self-pay

## 2021-04-20 MED ORDER — ALYACEN 1/35 1-35 MG-MCG PO TABS
1.0000 | ORAL_TABLET | Freq: Every day | ORAL | 3 refills | Status: AC
  Filled 2021-04-20 – 2021-05-10 (×2): qty 84, 84d supply, fill #0
  Filled 2021-07-28: qty 84, 84d supply, fill #1

## 2021-04-26 DIAGNOSIS — S3992XA Unspecified injury of lower back, initial encounter: Secondary | ICD-10-CM | POA: Diagnosis not present

## 2021-04-26 DIAGNOSIS — M419 Scoliosis, unspecified: Secondary | ICD-10-CM | POA: Diagnosis not present

## 2021-04-26 DIAGNOSIS — G894 Chronic pain syndrome: Secondary | ICD-10-CM | POA: Diagnosis not present

## 2021-04-26 DIAGNOSIS — M6281 Muscle weakness (generalized): Secondary | ICD-10-CM | POA: Diagnosis not present

## 2021-05-01 ENCOUNTER — Telehealth: Payer: 59 | Admitting: Emergency Medicine

## 2021-05-01 DIAGNOSIS — J029 Acute pharyngitis, unspecified: Secondary | ICD-10-CM

## 2021-05-01 NOTE — Progress Notes (Signed)
E-Visit for Sore Throat  We are sorry that you are not feeling well.  Here is how we plan to help!  Your symptoms indicate a likely viral infection (Pharyngitis).   Pharyngitis is inflammation in the back of the throat which can cause a sore throat, scratchiness and sometimes difficulty swallowing.   Pharyngitis is typically caused by a respiratory virus and will just run its course.  Please keep in mind that your symptoms could last up to 10 days.  For throat pain, we recommend over the counter oral pain relief medications such as acetaminophen or aspirin, or anti-inflammatory medications such as ibuprofen or naproxen sodium.  Topical treatments such as oral throat lozenges or sprays may be used as needed.  Avoid close contact with loved ones, especially the very young and elderly.  Remember to wash your hands thoroughly throughout the day as this is the number one way to prevent the spread of infection and wipe down door knobs and counters with disinfectant.  After careful review of your answers, it doesn't sound like you have influenza. I do not recommend antiviral medicine at this time.   After careful review of your answers, I would not recommend an antibiotic for your condition.  Antibiotics should not be used to treat conditions that we suspect are caused by viruses like the virus that causes the common cold or flu. However, some people can have Strep with atypical symptoms. You may need formal testing in clinic or office to confirm if your symptoms continue or worsen.   Providers prescribe antibiotics to treat infections caused by bacteria. Antibiotics are very powerful in treating bacterial infections when they are used properly.  To maintain their effectiveness, they should be used only when necessary.  Overuse of antibiotics has resulted in the development of super bugs that are resistant to treatment!    Home Care: Only take medications as instructed by your medical team. Do not drink  alcohol while taking these medications. A steam or ultrasonic humidifier can help congestion.  You can place a towel over your head and breathe in the steam from hot water coming from a faucet. You can also use medicines like Mucinex and/or things like saline nasal spray or nasal saline irrigation.  Avoid close contacts especially the very young and the elderly. Cover your mouth when you cough or sneeze. Always remember to wash your hands.  Get Help Right Away If: You develop worsening fever or throat pain. You develop a severe head ache or visual changes. Your symptoms persist after you have completed your treatment plan.  Make sure you Understand these instructions. Will watch your condition. Will get help right away if you are not doing well or get worse.   Thank you for choosing an e-visit.  Your e-visit answers were reviewed by a board certified advanced clinical practitioner to complete your personal care plan. Depending upon the condition, your plan could have included both over the counter or prescription medications.  Please review your pharmacy choice. Make sure the pharmacy is open so you can pick up prescription now. If there is a problem, you may contact your provider through [employer] and have the prescription routed to another pharmacy.  Your safety is important to [place]. If you have drug allergies check your prescription carefully.   For the next 24 hours you can use MyChart to ask questions about today's visit, request a non-urgent call back, or ask for a work or school excuse. You will get an email  in the next two days asking about your experience. I hope that your e-visit has been valuable and will speed your recovery.   I have spent 5 minutes in review of e-visit questionnaire, review and updating patient chart, medical decision making and response to patient.   Rica Mast, PhD, FNP-BC

## 2021-05-03 NOTE — Progress Notes (Signed)
Office Visit Note  Patient: Stacey Gonzalez             Date of Birth: [birth year]           MRN: [id number]             PCP: Pcp, No Referring: Ramond Dial, PA Visit Date: 05/04/2021  Subjective:    History of Present Illness: Stacey Gonzalez is a 25 y.o. female her for joint pain in multiple areas and elevated inflammatory markers. She had recent patella dislocation seen in orthopedics clinic with multiple previous dislocations of both sides for several years.  Symptoms have been occurring intermittently for years with pain in multiple areas.  She was diagnosed with scoliosis at age 2 and has episodic problems with her back going out with increased pain.  This typically improves with rest stretching and muscle relaxer medication.  She was recently seen in orthopedic surgery clinic in October with findings consistent for right patellar dislocation.  For at least the past 1 year she is also experiencing pain affecting her bilateral arms and around the front of her chest wall.  These are coming and going.  She is not seeing any definite swelling or discoloration of affected areas and no specific recall of provocative activity or events that trigger these.  More recently during this episode laboratory markers check showed an elevated sedimentation rate.  She feels joint stiffness that last throughout the entire day without any particular pattern.  Chest pain anteriorly does not significantly worsen or improve with deep inspiration or lying supine or on her side.  Activities of Daily Living:  Patient reports morning stiffness for all day.  Patient Denies nocturnal pain.  Difficulty dressing/grooming: Denies Difficulty climbing stairs: Reports Difficulty getting out of chair: Reports Difficulty using hands for taps, buttons, cutlery, and/or writing: Denies  Review of Systems  Constitutional:  Positive for fatigue.  HENT:  Negative for mouth sores, mouth dryness and nose dryness.   Eyes:   Negative for pain, itching and dryness.  Respiratory:  Negative for shortness of breath and difficulty breathing.   Cardiovascular:  Negative for chest pain and palpitations.  Gastrointestinal:  Negative for blood in stool, constipation and diarrhea.  Endocrine: Negative for increased urination.  Genitourinary:  Negative for difficulty urinating.  Musculoskeletal:  Positive for joint pain, joint pain, myalgias, morning stiffness, muscle tenderness and myalgias. Negative for joint swelling.  Skin:  Negative for color change, rash and redness.  Allergic/Immunologic: Positive for susceptible to infections.  Neurological:  Positive for dizziness, headaches and weakness. Negative for numbness and memory loss.  Hematological:  Positive for bruising/bleeding tendency.  Psychiatric/Behavioral:  Negative for confusion.     PMFS History:  Patient Active Problem List   Diagnosis Date Noted   Bilateral arm pain 05/04/2021   Elevated erythrocyte sedimentation rate 05/04/2021   Abnormal laboratory test result 05/04/2021   Morbid obesity ([place]) 01/13/2021   Fatigue 10/07/2020   Vitamin D deficiency 06/03/2020   Family history of colon cancer in mother 04/15/2019   IBS (irritable bowel syndrome) 12/15/2018   Pain in right knee 12/12/2017   Dislocation of right knee 12/11/2017   Urticaria 11/11/2017   Patellar dislocation 01/25/2015    Past Medical History:  Diagnosis Date   Scoliosis     Family History  Problem Relation Age of Onset   Cancer Mother        colorectal    Hypertension Brother    High Cholesterol Brother  Past Surgical History:  Procedure Laterality Date   TONSILLECTOMY     as a child   TYMPANOSTOMY TUBE PLACEMENT     as a child   Social History   Social History Narrative   Not on file   Immunization History  Administered Date(s) Administered   DTaP 01/31/1994, 04/05/1994, 05/23/1994, 03/06/1995, 12/21/1998   HIB (PRP-OMP) 01/31/1994, 04/05/1994, 05/23/1994,  11/15/1994   Hepatitis A 01/19/2008, 07/06/2009   Hepatitis B 10-11-1993, 01/31/1994, 05/23/1994   Hpv-Unspecified 06/04/2007, 08/04/2007   IPV 01/31/1994, 04/05/1994, 05/23/1994, 12/21/1998   Influenza,inj,Quad PF,6+ Mos 12/31/2015   Influenza-Unspecified 01/07/2017, 03/14/2020   MMR 03/06/1995, 12/21/1998   Meningococcal Conjugate 01/19/2008   Moderna Sars-Covid-2 Vaccination 05/19/2019, 06/15/2019   PPD Test 12/26/2015   Td 10/07/2020   Tdap 01/19/2008     Objective: Vital Signs: BP 118/87 (BP Location: Left Arm, Patient Position: Sitting, Cuff Size: Large)   Pulse 98   Ht _0  (1.626 m)   Wt 273 lb 6.4 oz (124 kg)   BMI 46.93 kg/m    Physical Exam Constitutional:      Appearance: She is obese.  Cardiovascular:     Rate and Rhythm: Normal rate and regular rhythm.  Pulmonary:     Effort: Pulmonary effort is normal.     Breath sounds: Normal breath sounds.  Musculoskeletal:     Right lower leg: No edema.     Left lower leg: No edema.  Lymphadenopathy:     Cervical: No cervical adenopathy.  Skin:    General: Skin is warm and dry.     Findings: No rash.  Neurological:     Mental Status: She is alert.  Psychiatric:        Mood and Affect: Mood normal.      Musculoskeletal Exam:  Shoulders full ROM no tenderness or swelling Elbows full ROM no tenderness or swelling Wrists full ROM no tenderness or swelling Fingers full ROM no tenderness or swelling No focal chest wall tenderness over the sternum or ribs or sides, no pain provoked with deep inspiration There is paraspinal muscle tenderness to palpation at the lumbar spine and above iliac crest, mild lateral hip tenderness to pressure Knees full ROM no tenderness or swelling Ankles full ROM no tenderness or swelling   Investigation: No additional findings.  Imaging: No results found.  Recent Labs: Lab Results  Component Value Date   WBC 8.3 10/07/2020   HGB 13.4 10/07/2020   PLT 210 10/07/2020   NA 138  06/02/2020   K 4.2 06/02/2020   CL 106 06/02/2020   CO2 17 (L) 06/02/2020   GLUCOSE 78 06/02/2020   BUN 10 06/02/2020   CREATININE 0.85 06/02/2020   BILITOT 0.4 06/02/2020   ALKPHOS 76 06/02/2020   AST 18 06/02/2020   ALT 11 06/02/2020   PROT 7.0 06/02/2020   ALBUMIN 4.1 06/02/2020   CALCIUM 9.3 06/02/2020   GFRAA 109 06/02/2020    Speciality Comments: No specialty comments available.  Procedures:  No procedures performed Allergies: Kiwi extract, Lavender oil, Latex, and Zithromax [azithromycin]   Assessment / Plan:     Visit Diagnoses: Bilateral arm pain   No abnormalities present on exam today with joint change or swelling or overlying skin changes.  Does have some mild tenderness to pressure diffusely that is not limited to a joint distribution.  Question if this could be some type of myofascial pain process could be small fiber neuropathy or other chronic pain syndrome.  Elevated erythrocyte sedimentation rate -  Plan: Sedimentation rate, C-reactive protein, Cyclic citrul peptide antibody, IgG  Previous elevated sedimentation rate no objective findings for synovitis on exam now.  We will repeat sed rate and CRP also checking a CCP antibody titer.  If findings are normal now may need to consider following up during a repeat exacerbation to take another look.  Orders: Orders Placed This Encounter  Procedures   Sedimentation rate   C-reactive protein   Cyclic citrul peptide antibody, IgG    No orders of the defined types were placed in this encounter.    Follow-Up Instructions: Return if symptoms worsen or fail to improve.   Collier Salina, MD  Note - This record has been created using [employer].  Chart creation errors have been sought, but may not always  have been located. Such creation errors do not reflect on  the standard of medical care.

## 2021-05-04 ENCOUNTER — Ambulatory Visit: Payer: 59 | Admitting: Internal Medicine

## 2021-05-04 ENCOUNTER — Encounter: Payer: Self-pay | Admitting: Internal Medicine

## 2021-05-04 ENCOUNTER — Other Ambulatory Visit: Payer: Self-pay

## 2021-05-04 VITALS — BP 118/87 | HR 98 | Ht 64.0 in | Wt 273.4 lb

## 2021-05-04 DIAGNOSIS — M419 Scoliosis, unspecified: Secondary | ICD-10-CM | POA: Diagnosis not present

## 2021-05-04 DIAGNOSIS — M79602 Pain in left arm: Secondary | ICD-10-CM | POA: Diagnosis not present

## 2021-05-04 DIAGNOSIS — R899 Unspecified abnormal finding in specimens from other organs, systems and tissues: Secondary | ICD-10-CM | POA: Diagnosis not present

## 2021-05-04 DIAGNOSIS — R700 Elevated erythrocyte sedimentation rate: Secondary | ICD-10-CM | POA: Diagnosis not present

## 2021-05-04 DIAGNOSIS — G894 Chronic pain syndrome: Secondary | ICD-10-CM | POA: Diagnosis not present

## 2021-05-04 DIAGNOSIS — M79601 Pain in right arm: Secondary | ICD-10-CM | POA: Diagnosis not present

## 2021-05-04 DIAGNOSIS — M6281 Muscle weakness (generalized): Secondary | ICD-10-CM | POA: Diagnosis not present

## 2021-05-04 DIAGNOSIS — S3992XA Unspecified injury of lower back, initial encounter: Secondary | ICD-10-CM | POA: Diagnosis not present

## 2021-05-04 NOTE — Patient Instructions (Signed)
I am checking another antibody marker for inflammatory arthritis. I am also checking whether inflammation tests are remaining elevated at this time. I don't think we need to do any more imaging right now.  I would like to try seeing you whenever symptoms return again would be more helpful. You can call in or send [place] a message we might be able to get a more specific answer or what to try for symptoms.

## 2021-05-05 LAB — SEDIMENTATION RATE: Sed Rate: 19 mm/h (ref 0–20)

## 2021-05-05 LAB — C-REACTIVE PROTEIN: CRP: 5.3 mg/L (ref <8.0)

## 2021-05-05 LAB — CYCLIC CITRUL PEPTIDE ANTIBODY, IGG: Cyclic Citrullin Peptide Ab: <16 UNITS

## 2021-05-08 NOTE — Progress Notes (Signed)
Lab tests for inflammatory markers are normal now, down from being elevated when checked earlier this year. I don't have any specific new medication to recommend right now, I think we should try to see her back PRN during an exacerbation or flare of symptoms in case I am just missing something right now.

## 2021-05-10 ENCOUNTER — Other Ambulatory Visit (HOSPITAL_COMMUNITY): Payer: Self-pay

## 2021-05-18 ENCOUNTER — Ambulatory Visit: Payer: 59 | Admitting: Internal Medicine

## 2021-06-15 ENCOUNTER — Ambulatory Visit: Payer: 59 | Admitting: Internal Medicine

## 2021-06-23 ENCOUNTER — Telehealth: Payer: 59 | Admitting: Physician Assistant

## 2021-06-23 DIAGNOSIS — Z8669 Personal history of other diseases of the nervous system and sense organs: Secondary | ICD-10-CM

## 2021-06-23 DIAGNOSIS — H9201 Otalgia, right ear: Secondary | ICD-10-CM

## 2021-06-23 MED ORDER — AMOXICILLIN 500 MG PO CAPS: 500.0000 mg | ORAL_CAPSULE | Freq: Three times a day (TID) | ORAL | 0 refills | Status: AC

## 2021-06-23 NOTE — Progress Notes (Signed)
E Visit for Swimmer's Ear  We are sorry that you are not feeling well. Here is how we plan to help!  Giving your current symptoms and significant history of middle ear infections, I will treat for suspected otitis media. I have sent in a script for Amoxicillin to take as directed. If you have a fever 102 and up and significantly worsening symptoms, this could indicate a more serious infection moving to the middle/inner and needs face to face evaluation in an office by a provider.  Your symptoms should improve over the next 3 days and should resolve in about 7 days.  HOME CARE:  Wash your hands frequently. You can take Acetominophen 650 mg every 4-6 hours as needed for pain.  If pain is severe or moderate, you can apply a heating pad (set on low) or hot water bottle (wrapped in a towel) to outer ear for 20 minutes.  This will also increase drainage. Avoid ear plugs Do not use Q-tips After showers, help the water run out by tilting your head to one side.  GET HELP RIGHT AWAY IF:  Fever is over 102.2 degrees. You develop progressive ear pain or hearing loss. Ear symptoms persist longer than 3 days after treatment.  MAKE SURE YOU:  Understand these instructions. Will watch your condition. Will get help right away if you are not doing well or get worse.  TO PREVENT SWIMMER'S EAR: Use a bathing cap or custom fitted swim molds to keep your ears dry. Towel off after swimming to dry your ears. Tilt your head or pull your earlobes to allow the water to escape your ear canal. If there is still water in your ears, consider using a hairdryer on the lowest setting.   Thank you for choosing an e-visit.  Your e-visit answers were reviewed by a board certified advanced clinical practitioner to complete your personal care plan. Depending upon the condition, your plan could have included both over the counter or prescription medications.  Please review your pharmacy choice. Make sure the pharmacy  is open so you can pick up prescription now. If there is a problem, you may contact your provider through [employer] and have the prescription routed to another pharmacy.  Your safety is important to [place]. If you have drug allergies check your prescription carefully.   For the next 24 hours you can use MyChart to ask questions about today's visit, request a non-urgent call back, or ask for a work or school excuse. You will get an email in the next two days asking about your experience. I hope that your e-visit has been valuable and will speed your recovery.

## 2021-06-23 NOTE — Progress Notes (Signed)
I have spent 5 minutes in review of e-visit questionnaire, review and updating patient chart, medical decision making and response to patient.   Rahma Meller Cody Maja Mccaffery, PA-C    

## 2021-06-27 ENCOUNTER — Ambulatory Visit: Payer: 59 | Admitting: Nurse Practitioner

## 2021-06-27 ENCOUNTER — Encounter: Payer: Self-pay | Admitting: Nurse Practitioner

## 2021-06-27 VITALS — BP 132/77 | HR 99 | Temp 99.0°F | Ht 64.0 in | Wt 271.0 lb

## 2021-06-27 DIAGNOSIS — H9201 Otalgia, right ear: Secondary | ICD-10-CM | POA: Diagnosis not present

## 2021-06-27 MED ORDER — IBUPROFEN 600 MG PO TABS: 600.0000 mg | ORAL_TABLET | Freq: Three times a day (TID) | ORAL | 0 refills | Status: DC | PRN

## 2021-06-27 MED ORDER — METHYLPREDNISOLONE ACETATE 80 MG/ML IJ SUSP
80.0000 mg | Freq: Once | INTRAMUSCULAR | Status: AC
  Administered 2021-06-27: 80 mg via INTRAMUSCULAR

## 2021-06-27 MED ORDER — HYOSCYAMINE SULFATE 0.125 MG PO TABS: 0.1250 mg | ORAL_TABLET | ORAL | 1 refills | Status: AC | PRN

## 2021-06-27 NOTE — Patient Instructions (Signed)
Otitis Media, Adult Otitis media is a condition in which the middle ear is red and swollen (inflamed) and full of fluid. The middle ear is the part of the ear that contains bones for hearing as well as air that helps send sounds to the brain. The condition usually goes away on its own. What are the causes? This condition is caused by a blockage in the eustachian tube. This tube connects the middle ear to the back of the nose. It normally allows air into the middle ear. The blockage is caused by fluid or swelling. Problems that can cause blockage include: A cold or infection that affects the nose, mouth, or throat. Allergies. An irritant, such as tobacco smoke. Adenoids that have become large. The adenoids are soft tissue located in the back of the throat, behind the nose and the roof of the mouth. Growth or swelling in the upper part of the throat, just behind the nose (nasopharynx). Damage to the ear caused by a change in pressure. This is called barotrauma. What increases the risk? You are more likely to develop this condition if you: Smoke or are exposed to tobacco smoke. Have an opening in the roof of your mouth (cleft palate). Have acid reflux. Have problems in your body's defense system (immune system). What are the signs or symptoms? Symptoms of this condition include: Ear pain. Fever. Problems with hearing. Being tired. Fluid leaking from the ear. Ringing in the ear. How is this treated? This condition can go away on its own within 3-5 days. But if the condition is caused by germs (bacteria) and does not go away on its own, or if it keeps coming back, your doctor may: Give you antibiotic medicines. Give you medicines for pain. Follow these instructions at home: Take over-the-counter and prescription medicines only as told by your doctor. If you were prescribed an antibiotic medicine, take it as told by your doctor. Do not stop taking it even if you start to feel better. Keep  all follow-up visits. Contact a doctor if: You have bleeding from your nose. There is a lump on your neck. You are not feeling better in 5 days. You feel worse instead of better. Get help right away if: You have pain that is not helped with medicine. You have swelling, redness, or pain around your ear. You get a stiff neck. You cannot move part of your face (paralysis). You notice that the bone behind your ear hurts when you touch it. You get a very bad headache. Summary Otitis media means that the middle ear is red, swollen, and full of fluid. This condition usually goes away on its own. If the problem does not go away, treatment may be needed. You may be given medicines to treat the infection or to treat your pain. If you were prescribed an antibiotic medicine, take it as told by your doctor. Do not stop taking it even if you start to feel better. Keep all follow-up visits. This information is not intended to replace advice given to you by your health care provider. Make sure you discuss any questions you have with your health care provider. Document Revised: 08/15/2020 Document Reviewed: 08/15/2020 Elsevier Patient Education  2022 Elsevier Inc.  

## 2021-06-27 NOTE — Progress Notes (Signed)
Acute Office Visit  Subjective:    Patient ID: Stacey Gonzalez, female    DOB: 07/06/1995, 26 y.o.   MRN: [id number]  Chief Complaint  Patient presents with   Otalgia    Right side ear pan x 5 days Tele-visit on Friday, rec'd amoxicillin 500mg  TID for 10 days Has not been helping Pain has radiated jaw and neck Not able to eat, antibiotic made her ill Fever Saturday and Sunday     Otalgia  There is pain in the right ear. This is a new problem. Episode onset: in the past 3- days. The problem occurs constantly. The problem has been unchanged. There has been no fever. The fever has been present for Less than 1 day. The pain is severe. Pertinent negatives include no coughing, ear discharge, headaches or sore throat. She has tried antibiotics for the symptoms. The treatment provided no relief.    Past Medical History:  Diagnosis Date   Scoliosis     Past Surgical History:  Procedure Laterality Date   TONSILLECTOMY     as a child   TYMPANOSTOMY TUBE PLACEMENT     as a child    Family History  Problem Relation Age of Onset   Cancer Mother        colorectal    Hypertension Brother    High Cholesterol Brother     Social History   Socioeconomic History   Marital status: Single    Spouse name: Not on file   Number of children: Not on file   Years of education: Not on file   Highest education level: Not on file  Occupational History   Not on file  Tobacco Use   Smoking status: Never   Smokeless tobacco: Never  Vaping Use   Vaping Use: Never used  Substance and Sexual Activity   Alcohol use: No   Drug use: Not Currently    Types: Barbituates   Sexual activity: Not Currently  Other Topics Concern   Not on file  Social History Narrative   Not on file   Social Determinants of Health   Financial Resource Strain: Not on file  Food Insecurity: Not on file  Transportation Needs: Not on file  Physical Activity: Not on file  Stress: Not on file  Social Connections:  Not on file  Intimate Partner Violence: Not on file    Outpatient Medications Prior to Visit  Medication Sig Dispense Refill   amoxicillin (AMOXIL) 500 MG capsule Take 1 capsule (500 mg total) by mouth 3 (three) times daily for 10 days. 30 capsule 0   cyclobenzaprine (FLEXERIL) 10 MG tablet Take 1 tablet (10 mg total) by mouth 3 (three) times daily as needed for muscle spasms. 30 tablet 0   norethindrone-ethinyl estradiol 1/35 (ALAYCEN 1/35) tablet Take 1 tablet by mouth daily. 84 tablet 3   hyoscyamine (LEVSIN) 0.125 MG tablet Take 1 tablet (0.125 mg total) by mouth every 4 (four) hours as needed. 30 tablet 1   No facility-administered medications prior to visit.    Allergies  Allergen Reactions   Kiwi Extract Anaphylaxis   Lavender Oil Other (See Comments)    Hives (if far away), migraine nausea eye swelling (if close)   Latex Rash   Zithromax [Azithromycin] Rash    Review of Systems  Constitutional: Negative.   HENT:  Positive for ear pain. Negative for ear discharge and sore throat.   Respiratory: Negative.  Negative for cough.   Genitourinary: Negative.   Neurological:  Negative for headaches.  All other systems reviewed and are negative.     Objective:    Physical Exam Vitals and nursing note reviewed.  Constitutional:      Appearance: She is obese.  HENT:     Head: Normocephalic.     Right Ear: External ear normal. No decreased hearing noted. Swelling and tenderness present. There is no impacted cerumen.     Left Ear: External ear normal. There is no impacted cerumen.     Nose: No congestion.     Mouth/Throat:     Mouth: Mucous membranes are moist.     Pharynx: Oropharynx is clear.  Eyes:     Conjunctiva/sclera: Conjunctivae normal.  Cardiovascular:     Rate and Rhythm: Normal rate and regular rhythm.     Pulses: Normal pulses.     Heart sounds: Normal heart sounds.  Pulmonary:     Effort: Pulmonary effort is normal.     Breath sounds: Normal breath  sounds.  Abdominal:     General: Bowel sounds are normal.  Skin:    General: Skin is warm.     Findings: No rash.  Neurological:     Mental Status: She is alert and oriented to person, place, and time.    BP 132/77    Pulse 99    Temp 99 F (37.2 C)    Ht 5\' 4"  (1.626 m)    Wt 271 lb (122.9 kg)    LMP 06/07/2021 (Approximate)    SpO2 98%    BMI 46.52 kg/m  Wt Readings from Last 3 Encounters:  06/27/21 271 lb (122.9 kg)  05/04/21 273 lb 6.4 oz (124 kg)  04/05/21 272 lb (123.4 kg)    Health Maintenance Due  Topic Date Due   HIV Screening  Never done   Hepatitis C Screening  Never done   PAP-Cervical Cytology Screening  Never done   PAP SMEAR-Modifier  Never done   COVID-19 Vaccine (3 - Booster for Moderna series) 08/10/2019   INFLUENZA VACCINE  12/19/2020    There are no preventive care reminders to display for this patient.   Lab Results  Component Value Date   TSH 1.810 10/07/2020   Lab Results  Component Value Date   WBC 8.3 10/07/2020   HGB 13.4 10/07/2020   HCT 40.5 10/07/2020   MCV 88 10/07/2020   PLT 210 10/07/2020   Lab Results  Component Value Date   NA 138 06/02/2020   K 4.2 06/02/2020   CO2 17 (L) 06/02/2020   GLUCOSE 78 06/02/2020   BUN 10 06/02/2020   CREATININE 0.85 06/02/2020   BILITOT 0.4 06/02/2020   ALKPHOS 76 06/02/2020   AST 18 06/02/2020   ALT 11 06/02/2020   PROT 7.0 06/02/2020   ALBUMIN 4.1 06/02/2020   CALCIUM 9.3 06/02/2020   ANIONGAP 9 03/25/2017        Assessment & Plan:    She appears well, afebrile. Right ear reveals tenderness of the tragus; debris and inflammation in external canal. TM is not well seen due to debris, but visualized aspects appear normal.  Presents as Otitis Externa   Instructed to keep ear dry until better; patient is already on antibiotics for 10 days given by Tele -visit 3 days ago, patient has 7 days to complete treatment. advised patient to take medication as prescribed. call if persistent pain,  swelling or fever, follow up visit as needed or if symptom not resolved.    Problem List Items Addressed  This Visit   None Visit Diagnoses     Right ear pain    -  Primary   Relevant Medications   hyoscyamine (LEVSIN) 0.125 MG tablet   ibuprofen (ADVIL) 600 MG tablet   methylPREDNISolone acetate (DEPO-MEDROL) injection 80 mg        Meds ordered this encounter  Medications   hyoscyamine (LEVSIN) 0.125 MG tablet    Sig: Take 1 tablet (0.125 mg total) by mouth every 4 (four) hours as needed.    Dispense:  30 tablet    Refill:  1    Order Specific Question:   Supervising Provider    Answer:   Standley Brooking   ibuprofen (ADVIL) 600 MG tablet    Sig: Take 1 tablet (600 mg total) by mouth every 8 (eight) hours as needed.    Dispense:  30 tablet    Refill:  0    Order Specific Question:   Supervising Provider    Answer:   Mechele Claude [[id number]]   methylPREDNISolone acetate (DEPO-MEDROL) injection 80 mg     Daryll Drown, NP

## 2021-07-11 DIAGNOSIS — K580 Irritable bowel syndrome with diarrhea: Secondary | ICD-10-CM | POA: Diagnosis not present

## 2021-07-28 ENCOUNTER — Other Ambulatory Visit (HOSPITAL_COMMUNITY): Payer: Self-pay

## 2022-06-08 DIAGNOSIS — R7303 Prediabetes: Secondary | ICD-10-CM | POA: Insufficient documentation

## 2022-07-19 ENCOUNTER — Encounter: Payer: Self-pay | Admitting: Radiology

## 2022-07-23 ENCOUNTER — Institutional Professional Consult (permissible substitution): Payer: Commercial Managed Care - PPO | Admitting: Primary Care

## 2022-07-29 NOTE — Progress Notes (Unsigned)
Cardiology Office Note:    Date:  08/01/2022   ID:  Stacey Gonzalez, DOB 07-01-1995, MRN [id number]  PCP:  Pcp, No  Cardiologist:  None  Electrophysiologist:  None   Referring MD: Heywood Bene, *   Chief Complaint  Patient presents with   Palpitations    History of Present Illness:    Stacey Gonzalez is a 27 y.o. female with a hx of obesity who is referred by Clyde Lundborg, PA for evaluation of palpitations.  She reports palpitations have been worsening over the last few months.  Occurring 1-2 times per week.  Feels like heart is skipping beats, will last for 30 to 40 seconds.  Also having episodes of chest pain occurring about once per day, describes as sharp pain but sometimes pressure in center of chest.  No clear cause, lasts about an hour.  She denies any shortness of breath.  Reports has not been exercising due to fatigue.  States that she feels tired all the time.  She has appointment next week with pulmonology for sleep study.  Reports she feels lightheaded with standing.  She denies any syncopal episodes.  No smoking history.  No caffeine or alcohol use.  No family history of heart disease.    Past Medical History:  Diagnosis Date   Scoliosis     Past Surgical History:  Procedure Laterality Date   TONSILLECTOMY     as a child   TYMPANOSTOMY TUBE PLACEMENT     as a child    Current Medications: Current Meds  Medication Sig   cyclobenzaprine (FLEXERIL) 10 MG tablet Take 1 tablet (10 mg total) by mouth 3 (three) times daily as needed for muscle spasms.   hyoscyamine (LEVSIN) 0.125 MG tablet Take 1 tablet (0.125 mg total) by mouth every 4 (four) hours as needed.   norethindrone-ethinyl estradiol 1/35 (ALAYCEN 1/35) tablet Take 1 tablet by mouth daily.     Allergies:   Kiwi extract, Lavender oil, Latex, and Zithromax [azithromycin]   Social History   Socioeconomic History   Marital status: Single    Spouse name: Not on file   Number of children: Not on  file   Years of education: Not on file   Highest education level: Not on file  Occupational History   Not on file  Tobacco Use   Smoking status: Never   Smokeless tobacco: Never  Vaping Use   Vaping Use: Never used  Substance and Sexual Activity   Alcohol use: No   Drug use: Not Currently    Types: Barbituates   Sexual activity: Not Currently  Other Topics Concern   Not on file  Social History Narrative   Not on file   Social Determinants of Health   Financial Resource Strain: Not on file  Food Insecurity: Not on file  Transportation Needs: Not on file  Physical Activity: Not on file  Stress: Not on file  Social Connections: Not on file     Family History: The patient's family history includes Cancer in her mother; High Cholesterol in her brother; Hypertension in her brother.  ROS:   Please see the history of present illness.     All other systems reviewed and are negative.  EKGs/Labs/Other Studies Reviewed:    The following studies were reviewed today:   EKG:   07/30/2022: Sinus tachycardia, rate 105, no ST abnormalities  Recent Labs: No results found for requested labs within last 365 days.  Recent Lipid Panel No results found  for: "CHOL", "TRIG", "HDL", "CHOLHDL", "VLDL", "LDLCALC", "LDLDIRECT"  Physical Exam:    VS:  Ht '5\' 5"'$  (1.651 m)   Wt 280 lb 12.8 oz (127.4 kg)   BMI 46.73 kg/m     Wt Readings from Last 3 Encounters:  07/30/22 280 lb 12.8 oz (127.4 kg)  06/27/21 271 lb (122.9 kg)  05/04/21 273 lb 6.4 oz (124 kg)     GEN:  Well nourished, well developed in no acute distress HEENT: Normal NECK: No JVD; No carotid bruits LYMPHATICS: No lymphadenopathy CARDIAC: RRR, no murmurs, rubs, gallops RESPIRATORY:  Clear to auscultation without rales, wheezing or rhonchi  ABDOMEN: Soft, non-tender, non-distended MUSCULOSKELETAL:  No edema; No deformity  SKIN: Warm and dry NEUROLOGIC:  Alert and oriented x 3 PSYCHIATRIC:  Normal affect   ASSESSMENT:     1. Palpitations   2. Chest pain of uncertain etiology   3. Daytime somnolence    PLAN:    Palpitations: Description concerning for arrhythmia, evaluate with Zio patch x 7 days.  Check echocardiogram to rule out structural heart disease  Chest pain: Atypical in description.  Given age and lack of risk factors, no further cardiac workup recommended at this time  Daytime somnolence: Concern for OSA.  She has been referred to pulmonology for sleep study, has appointment next week   RTC in 3 months  Medication Adjustments/Labs and Tests Ordered: Current medicines are reviewed at length with the patient today.  Concerns regarding medicines are outlined above.  Orders Placed This Encounter  Procedures   LONG TERM MONITOR (3-14 DAYS)   EKG 12-Lead   ECHOCARDIOGRAM COMPLETE   No orders of the defined types were placed in this encounter.   Patient Instructions  Medication Instructions:  Your physician recommends that you continue on your current medications as directed. Please refer to the Current Medication list given to you today.  *If you need a refill on your cardiac medications before your next appointment, please call your pharmacy*  Testing/Procedures: Your physician has requested that you have an echocardiogram. Echocardiography is a painless test that uses sound waves to create images of your heart. It provides your doctor with information about the size and shape of your heart and how well your heart's chambers and valves are working. This procedure takes approximately one hour. There are no restrictions for this procedure. Please do NOT wear cologne, perfume, aftershave, or lotions (deodorant is allowed). Please arrive 15 minutes prior to your appointment time.  ZIO XT- Long Term Monitor Instructions   Your physician has requested you wear a ZIO patch monitor for _7_ days.  This is a single patch monitor.   [employer] supplies one patch monitor per enrollment. Additional  stickers are not available. Please do not apply patch if you will be having a Nuclear Stress Test, Echocardiogram, Cardiac CT, MRI, or Chest Xray during the period you would be wearing the monitor. The patch cannot be worn during these tests. You cannot remove and re-apply the ZIO XT patch monitor.  Your ZIO patch monitor will be sent Fed Ex from [employer] directly to your home address. It may take 3-5 days to receive your monitor after you have been enrolled.  Once you have received your monitor, please review the enclosed instructions. Your monitor has already been registered assigning a specific monitor serial # to you.  Billing and Patient Assistance Program Information   We have supplied [employer] with any of your insurance information on file for billing purposes. [employer] offers  a sliding scale Patient Assistance Program for patients that do not have insurance, or whose insurance does not completely cover the cost of the ZIO monitor.   You must apply for the Patient Assistance Program to qualify for this discounted rate.     To apply, please call [employer] at [phone], select option 4, then select option 2, and ask to apply for Patient Assistance Program.  Theodore Demark will ask your household income, and how many people are in your household.  They will quote your out-of-pocket cost based on that information.  [employer] will also be able to set up a 43-month interest-free payment plan if needed.  Applying the monitor   Shave hair from upper left chest.  Hold abrader disc by orange tab. Rub abrader in 40 strokes over the upper left chest as indicated in your monitor instructions.  Clean area with 4 enclosed alcohol pads. Let dry.  Apply patch as indicated in monitor instructions. Patch will be placed under collarbone on left side of chest with arrow pointing upward.  Rub patch adhesive wings for 2 minutes. Remove white label marked "1". Remove the white label marked "2". Rub patch adhesive  wings for 2 additional minutes.  While looking in a mirror, press and release button in center of patch. A small green light will flash 3-4 times. This will be your only indicator that the monitor has been turned on. ?  Do not shower for the first 24 hours. You may shower after the first 24 hours.  Press the button if you feel a symptom. You will hear a small click. Record Date, Time and Symptom in the Patient Logbook.  When you are ready to remove the patch, follow instructions on the last 2 pages of the Patient Logbook. Stick patch monitor onto the last page of Patient Logbook.  Place Patient Logbook in the blue and white box.  Use locking tab on box and tape box closed securely.  The blue and white box has prepaid postage on it. Please place it in the mailbox as soon as possible. Your physician should have your test results approximately 7 days after [facility] monitor has [facility]  Call [employer] [phone] you have questions regarding your ZIO XT patch monitor. Call them immediately if you see an orange light blinking on your monitor.  If your monitor falls off in less than 4 days, contact our Monitor department at [phone] ?If your monitor becomes loose or falls off after 4 days call [employer] at [phone] suggestions on securing your monitor.?  Follow-Up: [facility] you and your health needs are our priority.  As part of our continuing mission to provide you with exceptional heart care, we have created designated Provider Care Teams.  These Care Teams include your primary Cardiologist (physician) and Advanced Practice Providers (APPs -  Physician Assistants and Nurse Practitioners) who all work together to provide you with the care you need, when you need it.  We recommend signing up for the patient portal called "MyChart".  Sign up information is provided on this After Visit Summary.  MyChart is used to connect with  patients for Virtual Visits (Telemedicine).  Patients are able to view lab/test results, encounter notes, upcoming appointments, etc.  Non-urgent messages can be sent to your provider as well.   To learn more about what you can do with MyChart, go to [email or web address]    Your next appointment:   3 month(s)  Provider:   Dr. Gardiner Rhyme   Signed, Donato Heinz, MD  08/01/2022 2:26 PM    [place]

## 2022-07-30 ENCOUNTER — Ambulatory Visit: Payer: Commercial Managed Care - PPO | Attending: Cardiology

## 2022-07-30 ENCOUNTER — Ambulatory Visit: Payer: 59 | Attending: Internal Medicine | Admitting: Cardiology

## 2022-07-30 VITALS — Ht 65.0 in | Wt 280.8 lb

## 2022-07-30 DIAGNOSIS — R079 Chest pain, unspecified: Secondary | ICD-10-CM | POA: Diagnosis not present

## 2022-07-30 DIAGNOSIS — R400 Somnolence: Secondary | ICD-10-CM

## 2022-07-30 DIAGNOSIS — R002 Palpitations: Secondary | ICD-10-CM

## 2022-07-30 NOTE — Patient Instructions (Signed)
Medication Instructions:  Your physician recommends that you continue on your current medications as directed. Please refer to the Current Medication list given to you today.  *If you need a refill on your cardiac medications before your next appointment, please call your pharmacy*  Testing/Procedures: Your physician has requested that you have an echocardiogram. Echocardiography is a painless test that uses sound waves to create images of your heart. It provides your doctor with information about the size and shape of your heart and how well your heart's chambers and valves are working. This procedure takes approximately one hour. There are no restrictions for this procedure. Please do NOT wear cologne, perfume, aftershave, or lotions (deodorant is allowed). Please arrive 15 minutes prior to your appointment time.  ZIO XT- Long Term Monitor Instructions   Your physician has requested you wear a ZIO patch monitor for _7_ days.  This is a single patch monitor.   [employer] supplies one patch monitor per enrollment. Additional stickers are not available. Please do not apply patch if you will be having a Nuclear Stress Test, Echocardiogram, Cardiac CT, MRI, or Chest Xray during the period you would be wearing the monitor. The patch cannot be worn during these tests. You cannot remove and re-apply the ZIO XT patch monitor.  Your ZIO patch monitor will be sent Fed Ex from [employer] directly to your home address. It may take 3-5 days to receive your monitor after you have been enrolled.  Once you have received your monitor, please review the enclosed instructions. Your monitor has already been registered assigning a specific monitor serial # to you.  Billing and Patient Assistance Program Information   We have supplied [employer] with any of your insurance information on file for billing purposes. [employer] offers a sliding scale Patient Assistance Program for patients that do not have insurance, or  whose insurance does not completely cover the cost of the ZIO monitor.   You must apply for the Patient Assistance Program to qualify for this discounted rate.     To apply, please call [employer] at [phone], select option 4, then select option 2, and ask to apply for Patient Assistance Program.  [employer] will ask your household income, and how many people are in your household.  They will quote your out-of-pocket cost based on that information.  [employer] will also be able to set up a 12-month, interest-free payment plan if needed.  Applying the monitor   Shave hair from upper left chest.  Hold abrader disc by orange tab. Rub abrader in 40 strokes over the upper left chest as indicated in your monitor instructions.  Clean area with 4 enclosed alcohol pads. Let dry.  Apply patch as indicated in monitor instructions. Patch will be placed under collarbone on left side of chest with arrow pointing upward.  Rub patch adhesive wings for 2 minutes. Remove white label marked "1". Remove the white label marked "2". Rub patch adhesive wings for 2 additional minutes.  While looking in a mirror, press and release button in center of patch. A small green light will flash 3-4 times. This will be your only indicator that the monitor has been turned on. ?  Do not shower for the first 24 hours. You may shower after the first 24 hours.  Press the button if you feel a symptom. You will hear a small click. Record Date, Time and Symptom in the Patient Logbook.  When you are ready to remove the patch, follow instructions on the last   2 pages of the Patient Logbook. Stick patch monitor onto the last page of Patient Logbook.  Place Patient Logbook in the blue and white box.  Use locking tab on box and tape box closed securely.  The blue and white box has prepaid postage on it. Please place it in the mailbox as soon as possible. Your physician should have your test results approximately 7 days after the monitor has been mailed  back to [employer].  Call [employer] Customer Care at [phone] if you have questions regarding your ZIO XT patch monitor. Call them immediately if you see an orange light blinking on your monitor.  If your monitor falls off in less than 4 days, contact our Monitor department at [phone]. ?If your monitor becomes loose or falls off after 4 days call [employer] at [phone] for suggestions on securing your monitor.?  Follow-Up: At [facility], you and your health needs are our priority.  As part of our continuing mission to provide you with exceptional heart care, we have created designated Provider Care Teams.  These Care Teams include your primary Cardiologist (physician) and Advanced Practice Providers (APPs -  Physician Assistants and Nurse Practitioners) who all work together to provide you with the care you need, when you need it.  We recommend signing up for the patient portal called "MyChart".  Sign up information is provided on this After Visit Summary.  MyChart is used to connect with patients for Virtual Visits (Telemedicine).  Patients are able to view lab/test results, encounter notes, upcoming appointments, etc.  Non-urgent messages can be sent to your provider as well.   To learn more about what you can do with MyChart, go to [email or web address].    Your next appointment:   3 month(s)  Provider:   Dr. Schumann  

## 2022-07-30 NOTE — Progress Notes (Unsigned)
Enrolled for [employer] to mail a ZIO XT long term holter monitor to the patients address on file.  

## 2022-08-06 ENCOUNTER — Institutional Professional Consult (permissible substitution): Payer: Commercial Managed Care - PPO | Admitting: Primary Care

## 2022-08-08 ENCOUNTER — Encounter: Payer: Self-pay | Admitting: Adult Health

## 2022-08-08 ENCOUNTER — Ambulatory Visit (INDEPENDENT_AMBULATORY_CARE_PROVIDER_SITE_OTHER): Payer: 59 | Admitting: Adult Health

## 2022-08-08 VITALS — BP 102/80 | HR 110 | Temp 98.5°F | Ht 65.0 in | Wt 286.6 lb

## 2022-08-08 DIAGNOSIS — R5383 Other fatigue: Secondary | ICD-10-CM

## 2022-08-08 DIAGNOSIS — R002 Palpitations: Secondary | ICD-10-CM

## 2022-08-08 DIAGNOSIS — G4700 Insomnia, unspecified: Secondary | ICD-10-CM | POA: Diagnosis not present

## 2022-08-08 DIAGNOSIS — R0683 Snoring: Secondary | ICD-10-CM

## 2022-08-08 NOTE — Progress Notes (Unsigned)
@Patient  ID: Stacey Gonzalez, female    DOB: 1996-04-28, 27 y.o.   MRN: [id number]  Chief Complaint  Patient presents with   Consult    Referring provider: Heywood Bene, *  HPI: 27 year old female seen for sleep consult August 08, 2022 for chronic insomnia and restless sleep.   TEST/EVENTS :   08/08/2022 Sleep consult  Patient presents for sleep consult.  Kindly referred by primary care provider Clyde Lundborg, PA .  Patient complains of longstanding trouble going to sleep.  Is been going on for years.  Has trouble going to sleep and also staying asleep.  Typically goes to bed about 10:30 PM to midnight.  Takes up to 45 minutes to go to sleep.  Is up several times throughout the night.  Gets up about 9 AM.  Weight is up about 12 pounds over the last 2 years.  Current weight is at 286 pounds with a BMI of 47.  Patient is never had a sleep study before.  Has no history of congestive heart failure or stroke.  No symptoms suspicious for cataplexy or sleep paralysis.    No removable dental work.  Caffeine intake - 3 cups of tea a week.  Epworth score is 6 out of 24.  Typically Gets Sleepy If She Sits down to Read or in the Evening Hours. Has been following with PCP  due to "brain fog" , difficult to concentrate, short term memory issues, speech issues, palpitations.   These are  new in last 4 months . Over last couple of years she has had severe fatigue, low energy , low activity tolerance, body aches -muscle and joint aches. No rash.  Unclear etiology . Followed by PCP. Has been evaluated by cardiology for new onset palpitations.  Echo is pending. Zio patch is pending.  No leg swelling /edema , dyspnea, cough .  Occasionally will get scattered red raised bump that come and go on arms and legs.  Tried Trazodone around age 74 for intermittent insomnia , worked well. Did not take on regular basis.  Can sleep some days okay up to 8hrs still feels tired. Does not nap.    Allergies   Allergen Reactions   Kiwi Extract Anaphylaxis and Hives   Lavender Oil Other (See Comments)    Hives (if far away), migraine nausea eye swelling (if close)   Latex Rash   Zithromax [Azithromycin] Rash    Immunization History  Administered Date(s) Administered   DTP 01/31/1994, 04/05/1994, 05/23/1994, 03/06/1995   DTaP 01/31/1994, 04/05/1994, 05/23/1994, 03/06/1995, 12/21/1998   HIB (PRP-OMP) 01/31/1994, 04/05/1994, 05/23/1994, 11/15/1994   HIB, Unspecified 01/31/1994, 04/05/1994, 05/23/1994, 11/15/1994   HPV Quadrivalent 06/04/2007, 08/04/2007   Hep B, Unspecified 11-10-1993, 01/31/1994, 05/23/1994   Hepatitis A 01/19/2008, 07/06/2009   Hepatitis A, Adult 01/19/2008, 07/06/2009   Hepatitis A, Ped/Adol-2 Dose 01/19/2008, 07/06/2009   Hepatitis B 14-May-1994, 01/31/1994, 05/23/1994   Hpv-Unspecified 06/04/2007, 08/04/2007   IPV 01/31/1994, 04/05/1994, 05/23/1994, 12/21/1998   Influenza Split 07/06/2009   Influenza,inj,Quad PF,6+ Mos 12/31/2015   Influenza-Unspecified 01/07/2017, 03/14/2020   MMR 03/06/1995, 12/21/1998   Meningococcal Acwy, Unspecified 01/19/2008   Meningococcal Conjugate 01/19/2008   Moderna Sars-Covid-2 Vaccination 05/19/2019, 06/15/2019   OPV 01/31/1994, 04/05/1994, 05/23/1994, 12/21/1998   PPD Test 12/26/2015   Td 10/07/2020   Tdap 01/19/2008    Past Medical History:  Diagnosis Date   Scoliosis     Tobacco History: Social History   Tobacco Use  Smoking Status Never  Smokeless Tobacco Never  Counseling given: Not Answered   Outpatient Medications Prior to Visit  Medication Sig Dispense Refill   cyclobenzaprine (FLEXERIL) 10 MG tablet Take 1 tablet (10 mg total) by mouth 3 (three) times daily as needed for muscle spasms. 30 tablet 0   Flaxseed, Linseed, (FLAX SEED OIL) 1000 MG CAPS Take 1,000 mg by mouth 2 (two) times a week.     hyoscyamine (LEVSIN) 0.125 MG tablet Take 1 tablet (0.125 mg total) by mouth every 4 (four) hours as needed. 30  tablet 1   norethindrone-ethinyl estradiol 1/35 (ALAYCEN 1/35) tablet Take 1 tablet by mouth daily. 84 tablet 3   ibuprofen (ADVIL) 600 MG tablet Take 1 tablet (600 mg total) by mouth every 8 (eight) hours as needed. 30 tablet 0   No facility-administered medications prior to visit.     Review of Systems:   Constitutional:   No  weight loss, night sweats,  Fevers, chills, fatigue, or  lassitude.  HEENT:   No headaches,  Difficulty swallowing,  Tooth/dental problems, or  Sore throat,                No sneezing, itching, ear ache, nasal congestion, post nasal drip,   CV:  No chest pain,  Orthopnea, PND, swelling in lower extremities, anasarca, dizziness, palpitations, syncope.   GI  No heartburn, indigestion, abdominal pain, nausea, vomiting, diarrhea, change in bowel habits, loss of appetite, bloody stools.   Resp: No shortness of breath with exertion or at rest.  No excess mucus, no productive cough,  No non-productive cough,  No coughing up of blood.  No change in color of mucus.  No wheezing.  No chest wall deformity  Skin: no rash or lesions.  GU: no dysuria, change in color of urine, no urgency or frequency.  No flank pain, no hematuria   MS:  No joint pain or swelling.  No decreased range of motion.  No back pain.    Physical Exam  BP 102/80 (BP Location: Left Arm, Patient Position: Sitting, Cuff Size: Large)   Pulse (!) 110   Temp 98.5 F (36.9 C) (Oral)   Ht 5\' 5"  (1.651 m)   Wt 286 lb 9.6 oz (130 kg)   SpO2 98%   BMI 47.69 kg/m   GEN: A/Ox3; pleasant , NAD, well nourished    HEENT:  Petal/AT,  EACs-clear, TMs-wnl, NOSE-clear, THROAT-clear, no lesions, no postnasal drip or exudate noted.   NECK:  Supple w/ fair ROM; no JVD; normal carotid impulses w/o bruits; no thyromegaly or nodules palpated; no lymphadenopathy.    RESP  Clear  P & A; w/o, wheezes/ rales/ or rhonchi. no accessory muscle use, no dullness to percussion  CARD:  RRR, no m/r/g, no peripheral edema,  pulses intact, no cyanosis or clubbing.  GI:   Soft & nt; nml bowel sounds; no organomegaly or masses detected.   Musco: Warm bil, no deformities or joint swelling noted.   Neuro: alert, no focal deficits noted.    Skin: Warm, no lesions or rashes    Lab Results:  CBC    Component Value Date/Time   WBC 8.3 10/07/2020 0906   WBC 13.7 (H) 03/25/2017 1300   RBC 4.62 10/07/2020 0906   RBC 4.54 03/25/2017 1300   HGB 13.4 10/07/2020 0906   HCT 40.5 10/07/2020 0906   PLT 210 10/07/2020 0906   MCV 88 10/07/2020 0906   MCH 29.0 10/07/2020 0906   MCH 28.9 03/25/2017 1300   MCHC 33.1 10/07/2020 0906  MCHC 33.2 03/25/2017 1300   RDW 12.8 10/07/2020 0906   LYMPHSABS 2.2 10/07/2020 0906   EOSABS 0.2 10/07/2020 0906   BASOSABS 0.0 10/07/2020 0906    BMET    Component Value Date/Time   NA 138 06/02/2020 0900   K 4.2 06/02/2020 0900   CL 106 06/02/2020 0900   CO2 17 (L) 06/02/2020 0900   GLUCOSE 78 06/02/2020 0900   GLUCOSE 96 03/25/2017 1300   BUN 10 06/02/2020 0900   CREATININE 0.85 06/02/2020 0900   CALCIUM 9.3 06/02/2020 0900   GFRNONAA 95 06/02/2020 0900   GFRAA 109 06/02/2020 0900    BNP No results found for: "BNP"  ProBNP No results found for: "PROBNP"  Imaging: No results found.        No data to display          No results found for: "NITRICOXIDE"      Assessment & Plan:   No problem-specific Assessment & Plan notes found for this encounter.     Rexene Edison, NP 08/08/2022

## 2022-08-08 NOTE — Patient Instructions (Signed)
Set up for home sleep study  Work on healthy sleep regimen  Do not drive If sleepy  Work on healthy weight loss Follow up in 6 weeks to discuss sleep study results and treatment plan

## 2022-08-08 NOTE — Progress Notes (Signed)
Reviewed and agree with assessment/plan.   Chesley Mires, MD [facility] Pulmonary/Critical Care 08/08/2022, 10:00 AM Pager:  [phone]

## 2022-08-09 ENCOUNTER — Ambulatory Visit: Payer: 59

## 2022-08-09 DIAGNOSIS — R0683 Snoring: Secondary | ICD-10-CM | POA: Insufficient documentation

## 2022-08-09 DIAGNOSIS — G4719 Other hypersomnia: Secondary | ICD-10-CM | POA: Insufficient documentation

## 2022-08-09 DIAGNOSIS — G4700 Insomnia, unspecified: Secondary | ICD-10-CM | POA: Insufficient documentation

## 2022-08-09 NOTE — Assessment & Plan Note (Signed)
Snoring, restless sleep, chronic insomnia, BMI 47 all suspicious for underlying sleep apnea Set patient up for home sleep study.  Plan  Patient Instructions  Set up for home sleep study  Work on healthy sleep regimen  Do not drive If sleepy  Work on healthy weight loss Follow up in 6 weeks to discuss sleep study results and treatment plan

## 2022-08-09 NOTE — Assessment & Plan Note (Signed)
Chronic insomnia.  Healthy sleep regimen was discussed in detail.  Hold on sleep aids at this time check home sleep study.

## 2022-08-09 NOTE — Assessment & Plan Note (Signed)
Healthy weight loss discussed 

## 2022-08-09 NOTE — Progress Notes (Signed)
Reviewed and agree with assessment/plan.   Chesley Mires, MD [facility] Pulmonary/Critical Care 08/09/2022, 2:28 PM Pager:  [phone]

## 2022-08-17 ENCOUNTER — Ambulatory Visit: Payer: Commercial Managed Care - PPO | Admitting: Internal Medicine

## 2022-09-06 ENCOUNTER — Ambulatory Visit (HOSPITAL_COMMUNITY): Payer: 59

## 2022-09-20 DIAGNOSIS — R410 Disorientation, unspecified: Secondary | ICD-10-CM | POA: Insufficient documentation

## 2022-09-24 ENCOUNTER — Encounter: Payer: Self-pay | Admitting: Neurology

## 2022-09-24 ENCOUNTER — Ambulatory Visit (INDEPENDENT_AMBULATORY_CARE_PROVIDER_SITE_OTHER): Payer: 59 | Admitting: Neurology

## 2022-09-24 VITALS — BP 132/93 | HR 95 | Ht 65.0 in | Wt 286.5 lb

## 2022-09-24 DIAGNOSIS — R404 Transient alteration of awareness: Secondary | ICD-10-CM

## 2022-09-24 DIAGNOSIS — R410 Disorientation, unspecified: Secondary | ICD-10-CM

## 2022-09-24 NOTE — Patient Instructions (Signed)
Continue current medications  Routine EEG  Return as needed

## 2022-09-24 NOTE — Progress Notes (Signed)
[facility]  PATIENT: Stacey Gonzalez DOB: [birth year]  REQUESTING CLINICIAN: Roger Kill, * HISTORY FROM: Patient  REASON FOR VISIT: Brain fog/Altered sensorium    HISTORICAL  CHIEF COMPLAINT:  Chief Complaint  Patient presents with   New Patient (Initial Visit)    Rm 12. Pt alone. Episodes of brain fog, feels like she is going to pass out. An out of body feeling she describes it as. Patient states these episodes come with difficulty to put words together, states it usually happens at least 3x weekly. Patient reports last episode being this morning.     HISTORY OF PRESENT ILLNESS:  This is a 27 year old woman past medical history of scoliosis who is presenting with complaint of brain fog and out of body experience.  In term of the brain fog, this started in November last year.  She reports episodes of forgetfulness, word finding difficulty, and being stuck during conversation.  She feels like these episodes are getting better. She also complains of out of body experiences, trouble controlling her limbs, difficulty walking during the episode.  This lasts less than 5 minutes.  She reports a few weeks ago, she had an episode lasting more than 20 minutes which required her to go to the hospital.  Her workup at that time was unrevealing and she was discharged home with neuro follow-up. 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 Her sleep is good   OTHER MEDICAL CONDITIONS: Scoliosis, Episodic migraines    REVIEW OF SYSTEMS: Full 14 system review of systems performed and negative with exception of: As noted in the HPI   ALLERGIES: Allergies  Allergen Reactions   Beeswax Itching and Swelling   Kiwi Extract Anaphylaxis and Hives    Latex Rash, Hives and Swelling   Lavender Oil Other (See Comments)    Hives (if far away), migraine nausea eye swelling (if close)   Zithromax [Azithromycin] Rash    HOME MEDICATIONS: Outpatient Medications Prior to Visit  Medication Sig Dispense Refill   cyclobenzaprine (FLEXERIL) 10 MG tablet Take 1 tablet (10 mg total) by mouth 3 (three) times daily as needed for muscle spasms. 30 tablet 0   Flaxseed, Linseed, (FLAX SEED OIL) 1000 MG CAPS Take 1,000 mg by mouth 2 (two) times a week.     hyoscyamine (LEVSIN) 0.125 MG tablet Take 1 tablet (0.125 mg total) by mouth every 4 (four) hours as needed. 30 tablet 1   norethindrone-ethinyl estradiol 1/35 (ALAYCEN 1/35) tablet Take 1 tablet by mouth daily. 84 tablet 3   No facility-administered medications prior to visit.    PAST MEDICAL HISTORY: Past Medical History:  Diagnosis Date   Scoliosis     PAST SURGICAL HISTORY: Past Surgical History:  Procedure Laterality Date   TONSILLECTOMY     as a child   TYMPANOSTOMY TUBE PLACEMENT     as a child    FAMILY HISTORY: Family History  Problem Relation Age of Onset   Cancer Mother        colorectal    Hypertension Brother    High Cholesterol Brother     SOCIAL HISTORY: Social History   Socioeconomic History   Marital status: Single    Spouse name: Not on file   Number of children:  Not on file   Years of education: Not on file   Highest education level: Not on file  Occupational History   Not on file  Tobacco Use   Smoking status: Never   Smokeless tobacco: Never  Vaping Use   Vaping Use: Never used  Substance and Sexual Activity   Alcohol use: No   Drug use: Not Currently    Types: Barbituates   Sexual activity: Not Currently  Other Topics Concern   Not on file  Social History Narrative   Not on file   Social Determinants of Health   Financial Resource Strain: Not on file  Food Insecurity: Not on file  Transportation Needs: Not on file  Physical Activity: Not  on file  Stress: Not on file  Social Connections: Not on file  Intimate Partner Violence: Not on file    PHYSICAL EXAM  GENERAL EXAM/CONSTITUTIONAL: Vitals:  Vitals:   09/24/22 1316  BP: (!) 132/93  Pulse: 95  Weight: 286 lb 8 oz (130 kg)  Height: 5\' 5"  (1.651 m)   Body mass index is 47.68 kg/m. Wt Readings from Last 3 Encounters:  09/24/22 286 lb 8 oz (130 kg)  08/08/22 286 lb 9.6 oz (130 kg)  07/30/22 280 lb 12.8 oz (127.4 kg)   Patient is in no distress; well developed, nourished and groomed; neck is supple  MUSCULOSKELETAL: Gait, strength, tone, movements noted in Neurologic exam below  NEUROLOGIC: MENTAL STATUS:      No data to display         awake, alert, oriented to person, place and time recent and remote memory intact normal attention and concentration language fluent, comprehension intact, naming intact fund of knowledge appropriate  CRANIAL NERVE:  2nd, 3rd, 4th, 6th - Visual fields full to confrontation, extraocular muscles intact, no nystagmus 5th - facial sensation symmetric 7th - facial strength symmetric 8th - hearing intact 9th - palate elevates symmetrically, uvula midline 11th - shoulder shrug symmetric 12th - tongue protrusion midline  MOTOR:  normal bulk and tone, full strength in the BUE, BLE  SENSORY:  normal and symmetric to light touch  COORDINATION:  finger-nose-finger, fine finger movements normal  REFLEXES:  deep tendon reflexes present and symmetric  GAIT/STATION:  normal    DIAGNOSTIC DATA (LABS, IMAGING, TESTING) - I reviewed patient records, labs, notes, testing and imaging myself where available.  Lab Results  Component Value Date   WBC 8.3 10/07/2020   HGB 13.4 10/07/2020   HCT 40.5 10/07/2020   MCV 88 10/07/2020   PLT 210 10/07/2020      Component Value Date/Time   NA 138 06/02/2020 0900   K 4.2 06/02/2020 0900   CL 106 06/02/2020 0900   CO2 17 (L) 06/02/2020 0900   GLUCOSE 78 06/02/2020 0900    GLUCOSE 96 03/25/2017 1300   BUN 10 06/02/2020 0900   CREATININE 0.85 06/02/2020 0900   CALCIUM 9.3 06/02/2020 0900   PROT 7.0 06/02/2020 0900   ALBUMIN 4.1 06/02/2020 0900   AST 18 06/02/2020 0900   ALT 11 06/02/2020 0900   ALKPHOS 76 06/02/2020 0900   BILITOT 0.4 06/02/2020 0900   GFRNONAA 95 06/02/2020 0900   GFRAA 109 06/02/2020 0900   No results found for: "CHOL", "HDL", "LDLCALC", "LDLDIRECT", "TRIG", "CHOLHDL" No results found for: "HGBA1C" Lab Results  Component Value Date   VITAMINB12 411 10/07/2020   Lab Results  Component Value Date   TSH 1.810 10/07/2020   07/09/2022 TSH 0.92 B12 303  ASSESSMENT AND PLAN  27 y.o. year old female with history of scoliosis, episodic migraines who is who is presenting with nonspecific symptoms including brain fog, confusion, word finding difficulty, episode of out of body experience with inability to move her limbs, feeling like she is stuck on the ground.  Again these symptoms are nonspecific, her neurological exam is nonfocal.  Will start by getting a routine EEG.  I also advised patient to keep a diary of the events and to contact me as soon as they occurred. She voices understanding.  I will contact her after the completion of the routine EEG otherwise I will see her if worse.   1. Transient alteration of awareness      Patient Instructions  Continue current medications  Routine EEG  Return as needed   Orders Placed This Encounter  Procedures   EEG adult    No orders of the defined types were placed in this encounter.   Return if symptoms worsen or fail to improve.    Windell Norfolk, MD 09/24/2022, 1:58 PM  [facility] [address] [place], [place] [address] [phone]

## 2022-09-26 ENCOUNTER — Ambulatory Visit: Payer: 59 | Admitting: Adult Health

## 2022-09-27 ENCOUNTER — Ambulatory Visit: Payer: Commercial Managed Care - PPO | Admitting: Neurology

## 2022-09-30 ENCOUNTER — Encounter: Payer: Self-pay | Admitting: Neurology

## 2022-10-10 ENCOUNTER — Ambulatory Visit (INDEPENDENT_AMBULATORY_CARE_PROVIDER_SITE_OTHER): Payer: 59 | Admitting: Neurology

## 2022-10-10 DIAGNOSIS — R4182 Altered mental status, unspecified: Secondary | ICD-10-CM | POA: Diagnosis not present

## 2022-10-10 DIAGNOSIS — R404 Transient alteration of awareness: Secondary | ICD-10-CM

## 2022-10-10 NOTE — Procedures (Signed)
    History:  27 year old woman with transient alteration of awareness   EEG classification: Awake and drowsy  Description of the recording: The background rhythms of this recording consists of a fairly well modulated medium amplitude alpha rhythm of 11 Hz that is reactive to eye opening and closure. Present in the anterior head region is a 15-20 Hz beta activity. Photic stimulation was performed, did not show any abnormalities. Hyperventilation was also performed, did not show any abnormalities. Drowsiness was manifested by background fragmentation. No abnormal epileptiform discharges seen during this recording. There was no focal slowing. There were no electrographic seizure identified.   Abnormality: None   Impression: This is a normal EEG recorded while drowsy and awake. No evidence of interictal epileptiform discharges. Normal EEGs, however, do not rule out epilepsy.    Windell Norfolk, MD [facility]

## 2022-11-19 ENCOUNTER — Ambulatory Visit: Payer: Commercial Managed Care - PPO | Admitting: Cardiology

## 2024-01-29 ENCOUNTER — Other Ambulatory Visit (HOSPITAL_COMMUNITY): Payer: Self-pay | Admitting: Adult Health Nurse Practitioner

## 2024-01-29 DIAGNOSIS — N921 Excessive and frequent menstruation with irregular cycle: Secondary | ICD-10-CM

## 2024-01-29 DIAGNOSIS — N946 Dysmenorrhea, unspecified: Secondary | ICD-10-CM
